# Patient Record
Sex: Male | Born: 1975 | Race: White | Hispanic: No | Marital: Single | Smoking: Never smoker
Health system: Southern US, Community
[De-identification: ages and names within clinical notes are randomized; demographics above are authoritative.]

## PROBLEM LIST (undated history)

## (undated) ENCOUNTER — Emergency Department: Discharge status: Absent without leave | Payer: Medicaid Other

## (undated) DIAGNOSIS — N189 Chronic kidney disease, unspecified: Secondary | ICD-10-CM

## (undated) DIAGNOSIS — F32A Depression, unspecified: Secondary | ICD-10-CM

## (undated) DIAGNOSIS — I10 Essential (primary) hypertension: Secondary | ICD-10-CM

## (undated) DIAGNOSIS — F1010 Alcohol abuse, uncomplicated: Secondary | ICD-10-CM

## (undated) DIAGNOSIS — E538 Deficiency of other specified B group vitamins: Secondary | ICD-10-CM

## (undated) DIAGNOSIS — D509 Iron deficiency anemia, unspecified: Secondary | ICD-10-CM

## (undated) DIAGNOSIS — F419 Anxiety disorder, unspecified: Secondary | ICD-10-CM

## (undated) DIAGNOSIS — T7840XA Allergy, unspecified, initial encounter: Secondary | ICD-10-CM

## (undated) DIAGNOSIS — M1990 Unspecified osteoarthritis, unspecified site: Secondary | ICD-10-CM

## (undated) DIAGNOSIS — R011 Cardiac murmur, unspecified: Secondary | ICD-10-CM

## (undated) DIAGNOSIS — Z5189 Encounter for other specified aftercare: Secondary | ICD-10-CM

## (undated) DIAGNOSIS — R7989 Other specified abnormal findings of blood chemistry: Secondary | ICD-10-CM

## (undated) DIAGNOSIS — Z9289 Personal history of other medical treatment: Secondary | ICD-10-CM

## (undated) DIAGNOSIS — D563 Thalassemia minor: Secondary | ICD-10-CM

## (undated) DIAGNOSIS — G4730 Sleep apnea, unspecified: Secondary | ICD-10-CM

## (undated) DIAGNOSIS — M109 Gout, unspecified: Secondary | ICD-10-CM

## (undated) HISTORY — DX: Chronic kidney disease, unspecified: N18.9

## (undated) HISTORY — DX: Depression, unspecified: F32.A

## (undated) HISTORY — PX: HAND SURGERY: SHX662

## (undated) HISTORY — DX: Sleep apnea, unspecified: G47.30

## (undated) HISTORY — DX: Allergy, unspecified, initial encounter: T78.40XA

## (undated) HISTORY — DX: Cardiac murmur, unspecified: R01.1

## (undated) HISTORY — DX: Anxiety disorder, unspecified: F41.9

## (undated) HISTORY — DX: Unspecified osteoarthritis, unspecified site: M19.90

## (undated) HISTORY — DX: Encounter for other specified aftercare: Z51.89

---

## 2020-09-27 ENCOUNTER — Other Ambulatory Visit: Payer: Self-pay

## 2020-09-27 ENCOUNTER — Emergency Department: Payer: BC Managed Care – PPO

## 2020-09-27 ENCOUNTER — Emergency Department
Admission: EM | Admit: 2020-09-27 | Discharge: 2020-09-27 | Disposition: A | Discharge status: Home / Self Care | Payer: BC Managed Care – PPO | Attending: Emergency Medicine | Admitting: Emergency Medicine

## 2020-09-27 ENCOUNTER — Encounter: Payer: Self-pay | Admitting: Emergency Medicine

## 2020-09-27 DIAGNOSIS — M25462 Effusion, left knee: Secondary | ICD-10-CM

## 2020-09-27 DIAGNOSIS — M25562 Pain in left knee: Secondary | ICD-10-CM | POA: Diagnosis present

## 2020-09-27 HISTORY — DX: Gout, unspecified: M10.9

## 2020-09-27 MED ORDER — DICLOFENAC SODIUM 75 MG PO TBEC
75.0000 mg | DELAYED_RELEASE_TABLET | Freq: Two times a day (BID) | ORAL | 0 refills | Status: DC
Start: 2020-09-27 — End: 2021-10-11

## 2020-09-27 MED ORDER — HYDROCODONE-ACETAMINOPHEN 5-325 MG PO TABS
1.0000 | ORAL_TABLET | Freq: Three times a day (TID) | ORAL | 0 refills | Status: AC | PRN
Start: 2020-09-27 — End: 2020-09-30

## 2020-09-27 MED ORDER — COLCHICINE 0.6 MG PO TABS: ORAL_TABLET | ORAL | 0 refills | Status: DC

## 2020-09-27 NOTE — ED Provider Notes (Signed)
[facility] Emergency Department Provider Note ____________________________________________  Time seen: 29  I have reviewed the triage vital signs and the nursing notes.  HISTORY  Chief Complaint  Knee Pain   HPI Shawn Wolfe is a 44 y.o. male presents himself to the ED for ongoing but slightly improved left knee pain.  Patient gives remote history of gout, with previous flares to the toe and ankle on the same leg.  He denies any preceding injury or trauma.  He describes significant swelling to the knee joint that made range of motion difficult.  He denies any interim fever, chills, sweats.  He did report some increased warmth to the knee joint, as well as some increased exquisite tenderness, but symptoms have somewhat improved after he began to take an over-the-counter gout joint supplement.  Patient presents with pain rated at a 6 out of 10 currently.  No other gout medicines have previously been prescribed patient does not have a current primary care provider, has never been treated for an acute gout flare in a formal setting.  He denies any history of chronic or ongoing knee problems.   Past Medical History:  Diagnosis Date  . Gout     There are no problems to display for this patient.   History reviewed. No pertinent surgical history.  Prior to Admission medications   Medication Sig Start Date End Date Taking? Authorizing Provider  colchicine 0.6 MG tablet Take 2 tabs Once  May repeat in 1 hour 09/27/20   Azim Gillingham, Charlesetta Ivory, PA-C  diclofenac (VOLTAREN) 75 MG EC tablet Take 1 tablet (75 mg total) by mouth 2 (two) times daily. 09/27/20   Noel Henandez, Charlesetta Ivory, PA-C  HYDROcodone-acetaminophen (NORCO) 5-325 MG tablet Take 1 tablet by mouth 3 (three) times daily as needed for up to 3 days. 09/27/20 09/30/20  Arren Laminack, Charlesetta Ivory, PA-C    Allergies Patient has no known allergies.  History reviewed. No pertinent family history.  Social  History Social History   Tobacco Use  . Smoking status: Not on file  Substance Use Topics  . Alcohol use: Not on file  . Drug use: Not on file    Review of Systems  Constitutional: Negative for fever. Cardiovascular: Negative for chest pain. Respiratory: Negative for shortness of breath. Gastrointestinal: Negative for abdominal pain, vomiting and diarrhea. Genitourinary: Negative for dysuria. Musculoskeletal: Negative for back pain.  Left knee swelling as above.   Skin: Negative for rash. Neurological: Negative for headaches, focal weakness or numbness. ____________________________________________  PHYSICAL EXAM:  VITAL SIGNS: ED Triage Vitals  Enc Vitals Group     BP 09/27/20 1427 (!) 144/85     Pulse Rate 09/27/20 1427 99     Resp 09/27/20 1427 14     Temp 09/27/20 1427 99.9 F (37.7 C)     Temp Source 09/27/20 1427 Oral     SpO2 09/27/20 1427 100 %     Weight 09/27/20 1135 180 lb (81.6 kg)     Height 09/27/20 1135 5\' 8"  (1.727 m)     Head Circumference --      Peak Flow --      Pain Score 09/27/20 1134 6     Pain Loc --      Pain Edu? --      Excl. in GC? --     Constitutional: Alert and oriented. Well appearing and in no distress. Head: Normocephalic and atraumatic. Eyes: Conjunctivae are normal. Normal extraocular movements Cardiovascular: Normal rate,  regular rhythm. Normal distal pulses. Respiratory: Normal respiratory effort. No wheezes/rales/rhonchi. Musculoskeletal: Left knee with a moderate joint effusion appreciated.  No erythema, warmth, or induration is noted overlying the knee joint.  No skin disruption, laceration, puncture is appreciated.  Normal active range of motion to the knee without signs of internal derangement.  Nontender with normal range of motion in all extremities.  Neurologic: Mildly antalgic gait without ataxia. Normal speech and language. No gross focal neurologic deficits are appreciated. Skin:  Skin is warm, dry and intact. No rash  noted. ____________________________________________   RADIOLOGY  DG Left Knee IMPRESSION: Large joint effusion.  Otherwise negative. ____________________________________________  PROCEDURES  Procedures ____________________________________________  INITIAL IMPRESSION / ASSESSMENT AND PLAN / ED COURSE  Patient with ED evaluation of joint swelling on the left knee.  Patient with a history of gout presents with persistent but improved swelling to the left knee.  Clinical picture is consistent with a moderate effusion likely representing a gout flare.  Patient will be treated with colchicine, diclofenac, and hydrocodone for breakthrough pain.  He is also referred to a local Ortho urgent care for continued symptoms if symptoms do not improve the next several days.  Return precautions have been discussed with patient is also advised to select a primary care provider for routine medical care.  Shawn Wolfe was evaluated in Emergency Department on 09/27/2020 for the symptoms described in the history of present illness. He was evaluated in the context of the global COVID-19 pandemic, which necessitated consideration that the patient might be at risk for infection with the SARS-CoV-2 virus that causes COVID-19. Institutional protocols and algorithms that pertain to the evaluation of patients at risk for COVID-19 are in a state of rapid change based on information released by regulatory bodies including the CDC and federal and state organizations. These policies and algorithms were followed during the patient's care in the ED.  I reviewed the patient's prescription history over the last 12 months in the multi-state controlled substances database(s) that includes [place], [place], [place], [place], [place], [place], [place], [place], [place], [place], [place], [place], [place], and [place].  Results were notable for no RX history.   ____________________________________________  FINAL CLINICAL IMPRESSION(S) / ED DIAGNOSES  Final diagnoses:  Effusion of left knee  Acute pain of left knee      Karmen Stabs, Charlesetta Ivory, PA-C 09/27/20 1916    Jene Every, MD 09/27/20 [id number]

## 2020-09-27 NOTE — ED Triage Notes (Signed)
Left knee pain x 3 days.  Denies injury.  States has history of gout.

## 2020-09-27 NOTE — ED Notes (Signed)
Left knee pain for few days wit swelling of entire knee.  He thought it was gout as he has history, but it is not going down.  No injury recalled.

## 2020-09-27 NOTE — Discharge Instructions (Signed)
Take the prescription meds as directed. Rest with the leg elevated and apply ice to reduce swelling. Follow-up with [facility] for ongoing symptoms. Select a primary care provider for routine medical care. Return as needed.

## 2020-12-18 ENCOUNTER — Other Ambulatory Visit: Payer: Self-pay

## 2020-12-18 ENCOUNTER — Emergency Department
Admission: EM | Admit: 2020-12-18 | Discharge: 2020-12-18 | Disposition: A | Discharge status: Home / Self Care | Payer: BC Managed Care – PPO | Attending: Emergency Medicine | Admitting: Emergency Medicine

## 2020-12-18 ENCOUNTER — Emergency Department: Payer: BC Managed Care – PPO

## 2020-12-18 ENCOUNTER — Encounter: Payer: Self-pay | Admitting: Emergency Medicine

## 2020-12-18 DIAGNOSIS — Y92002 Bathroom of unspecified non-institutional (private) residence single-family (private) house as the place of occurrence of the external cause: Secondary | ICD-10-CM | POA: Insufficient documentation

## 2020-12-18 DIAGNOSIS — M109 Gout, unspecified: Secondary | ICD-10-CM

## 2020-12-18 DIAGNOSIS — M25522 Pain in left elbow: Secondary | ICD-10-CM | POA: Diagnosis not present

## 2020-12-18 DIAGNOSIS — M5412 Radiculopathy, cervical region: Secondary | ICD-10-CM | POA: Insufficient documentation

## 2020-12-18 DIAGNOSIS — W19XXXA Unspecified fall, initial encounter: Secondary | ICD-10-CM | POA: Insufficient documentation

## 2020-12-18 DIAGNOSIS — I10 Essential (primary) hypertension: Secondary | ICD-10-CM | POA: Diagnosis not present

## 2020-12-18 DIAGNOSIS — M25562 Pain in left knee: Secondary | ICD-10-CM | POA: Insufficient documentation

## 2020-12-18 HISTORY — DX: Essential (primary) hypertension: I10

## 2020-12-18 MED ORDER — PREDNISONE 10 MG PO TABS: ORAL_TABLET | ORAL | 0 refills | Status: DC

## 2020-12-18 MED ORDER — PREDNISONE 20 MG PO TABS
60.0000 mg | ORAL_TABLET | Freq: Once | ORAL | Status: AC
  Administered 2020-12-18: 14:00:00 60 mg via ORAL
  Filled 2020-12-18: qty 3

## 2020-12-18 MED ORDER — NAPROXEN 500 MG PO TABS
500.0000 mg | ORAL_TABLET | Freq: Once | ORAL | Status: AC
  Administered 2020-12-18: 14:00:00 500 mg via ORAL
  Filled 2020-12-18: qty 1

## 2020-12-18 MED ORDER — NAPROXEN 500 MG PO TABS: 500.0000 mg | ORAL_TABLET | Freq: Two times a day (BID) | ORAL | 0 refills | Status: DC

## 2020-12-18 NOTE — ED Triage Notes (Signed)
Pt presents via POV with c/o shoulder pain post fall. Pt has gout which causes swelling in bilateral knees which caused him to fall last night. Pt hit left shoulder with fall and has limited mobility at this time. Pt denies LOC or hitting head with fall. No blood thinner use.

## 2020-12-18 NOTE — ED Provider Notes (Signed)
[facility] Emergency Department Provider Note  ____________________________________________  Time seen: Approximately 12:36 PM  I have reviewed the triage vital signs and the nursing notes.   HISTORY  Chief Complaint Fall    HPI Shawn Wolfe is a 44 y.o. male that presents to the emergency department for evaluation of right knee pain and left forearm pain.  Patient states that 1 week ago, he was lifting a heavy coffin at a funeral, when he pulled something in his left forearm.  He has developed pain that shoots from his forearm to his elbow since.  Several days ago, he developed right knee pain and swelling.  Knee is more painful when he tries to extend his knee.  Yesterday, his knee pain caused him to fall in the bathroom.  He landed on his left forearm and made his forearm pain worse than it has been the last week.  Pain is worse when he rotates his forearm.  He did not hit his head or lose consciousness.  Patient states that he does have a history of gout.  He was seen in this emergency department 2 months ago for left knee pain, had x-rays completed, and was started on medications for gout.  Knee pain and swelling in the left knee resolved. Right knee pain feels the same as the left did currently. Patient states that he enjoys eating red meat and drinking alcohol.  No IV drug use.  Patient works at [employer].  He is trying to establish with primary care.    Past Medical History:  Diagnosis Date  . Gout   . Hypertension     There are no problems to display for this patient.   History reviewed. No pertinent surgical history.  Prior to Admission medications   Medication Sig Start Date End Date Taking? Authorizing Provider  colchicine 0.6 MG tablet Take 2 tabs Once  May repeat in 1 hour 09/27/20   Menshew, Charlesetta Ivory, PA-C  diclofenac (VOLTAREN) 75 MG EC tablet Take 1 tablet (75 mg total) by mouth 2 (two) times daily. 09/27/20   Menshew, Charlesetta Ivory,  PA-C  naproxen (NAPROSYN) 500 MG tablet Take 1 tablet (500 mg total) by mouth 2 (two) times daily with a meal. 12/18/20 12/18/21  Enid Derry, PA-C  predniSONE (DELTASONE) 10 MG tablet Take 6 tablets on day 1, take 5 tablets on day 2, take 4 tablets on day 3, take 3 tablets on day 4, take 2 tablets on day 5, take 1 tablet on day 6 12/18/20   Enid Derry, PA-C    Allergies Sulfa antibiotics  History reviewed. No pertinent family history.  Social History Social History   Tobacco Use  . Smoking status: Never Smoker  . Smokeless tobacco: Current User     Review of Systems  Cardiovascular: No chest pain. Respiratory: No SOB. Gastrointestinal: No abdominal pain.  No nausea, no vomiting.  Genitourinary: Negative for dysuria. Musculoskeletal: Positive for left forearm and right knee pain. Skin: Negative for rash, abrasions, lacerations, ecchymosis. Neurological: Negative for headaches, numbness or tingling   ____________________________________________   PHYSICAL EXAM:  VITAL SIGNS: ED Triage Vitals  Enc Vitals Group     BP 12/18/20 1044 (!) 146/96     Pulse Rate 12/18/20 1044 89     Resp 12/18/20 1044 17     Temp 12/18/20 1044 98.7 F (37.1 C)     Temp Source 12/18/20 1044 Oral     SpO2 12/18/20 1044 96 %  Weight 12/18/20 1045 185 lb (83.9 kg)     Height 12/18/20 1045 5\' 7"  (1.702 m)     Head Circumference --      Peak Flow --      Pain Score 12/18/20 1045 7     Pain Loc --      Pain Edu? --      Excl. in GC? --      Constitutional: Alert and oriented. Well appearing and in no acute distress. Eyes: Conjunctivae are normal. PERRL. EOMI. Head: Atraumatic. ENT:      Ears:      Nose: No congestion/rhinnorhea.      Mouth/Throat: Mucous membranes are moist.  Neck: No stridor.  No cervical spine tenderness to palpation. Cardiovascular: Normal rate, regular rhythm.  Good peripheral circulation.  Symmetric radial pulses bilaterally. Respiratory: Normal  respiratory effort without tachypnea or retractions. Lungs CTAB. Good air entry to the bases with no decreased or absent breath sounds. Gastrointestinal: Bowel sounds 4 quadrants. Soft and nontender to palpation. No guarding or rigidity. No palpable masses. No distention.  Musculoskeletal: Full range of motion to all extremities. No gross deformities appreciated.  Right knee swelling and warmth.  Full range of motion of right knee with pain.  Full range of motion of left shoulder, left elbow, left wrist.  Mild tenderness to palpation to proximal left forearm next to the elbow.  Strength equal in upper extremities bilaterally. Neurologic:  Normal speech and language. No gross focal neurologic deficits are appreciated.  Skin:  Skin is warm, dry and intact. No rash noted. Psychiatric: Mood and affect are normal. Speech and behavior are normal. Patient exhibits appropriate insight and judgement.   ____________________________________________   LABS (all labs ordered are listed, but only abnormal results are displayed)  Labs Reviewed - No data to display ____________________________________________  EKG   ____________________________________________  RADIOLOGY 12/20/20, personally viewed and evaluated these images (plain radiographs) as part of my medical decision making, as well as reviewing the written report by the radiologist.  DG Forearm Left  Result Date: 12/18/2020 CLINICAL DATA:  Left forearm pain after fall earlier today. EXAM: LEFT FOREARM - 2 VIEW COMPARISON:  None. FINDINGS: There is no evidence of fracture or other focal bone lesions. Soft tissues are unremarkable. IMPRESSION: Negative. Electronically Signed   By: 12/20/2020 M.D.   On: 12/18/2020 13:16   DG Shoulder Left  Result Date: 12/18/2020 CLINICAL DATA:  Fall. EXAM: LEFT SHOULDER - 2+ VIEW COMPARISON:  None. FINDINGS: There is no evidence of fracture or dislocation. There is no evidence of arthropathy or  other focal bone abnormality. Soft tissues are unremarkable. IMPRESSION: Negative. Electronically Signed   By: 01-20-1973 M.D.   On: 12/18/2020 11:52    ____________________________________________    PROCEDURES  Procedure(s) performed:    Procedures    Medications  predniSONE (DELTASONE) tablet 60 mg (60 mg Oral Given 12/18/20 1427)  naproxen (NAPROSYN) tablet 500 mg (500 mg Oral Given 12/18/20 1427)     ____________________________________________   INITIAL IMPRESSION / ASSESSMENT AND PLAN / ED COURSE  Pertinent labs & imaging results that were available during my care of the patient were reviewed by me and considered in my medical decision making (see chart for details).  Review of the [place] CSRS was performed in accordance of the [facility] prior to dispensing any controlled drugs.   Patient's diagnosis is consistent with tendinitis in the left forearm and gout of the right knee.  Vital signs and  exam are reassuring.  Exam is consistent with a tendinitis in the left forearm.  X-rays are negative for fracture.  Knee exam is consistent with gout.  No risk factors for a septic joint.  Patient will be started on prednisone to cover him for gout and tendinitis.  He will also begin naproxen.  Patient will be discharged home with prescriptions for prednisone and naproxen. Patient is to follow up with orthopedics as directed. Patient is given ED precautions to return to the ED for any worsening or new symptoms.  Shawn Wolfe was evaluated in Emergency Department on 12/19/2020 for the symptoms described in the history of present illness. He was evaluated in the context of the global COVID-19 pandemic, which necessitated consideration that the patient might be at risk for infection with the SARS-CoV-2 virus that causes COVID-19. Institutional protocols and algorithms that pertain to the evaluation of patients at risk for COVID-19 are in a state of rapid change based on information  released by regulatory bodies including the CDC and federal and state organizations. These policies and algorithms were followed during the patient's care in the ED.   ____________________________________________  FINAL CLINICAL IMPRESSION(S) / ED DIAGNOSES  Final diagnoses:  Acute gout of left knee, unspecified cause  Left elbow pain  Cervical radiculopathy      NEW MEDICATIONS STARTED DURING THIS VISIT:  ED Discharge Orders         Ordered    predniSONE (DELTASONE) 10 MG tablet        12/18/20 1417    naproxen (NAPROSYN) 500 MG tablet  2 times daily with meals        12/18/20 1417              This chart was dictated using voice recognition software/Dragon. Despite best efforts to proofread, errors can occur which can change the meaning. Any change was purely unintentional.    Enid Derry, PA-C 12/19/20 0800    Arnaldo Natal, MD 12/20/20 [id number]

## 2020-12-18 NOTE — ED Notes (Addendum)
Pt c/o gout flair-up in right knee and states he slipped and fell last night and injured left forearm/shoulder. Pt denies head/other injury, denies dizziness/LOC. Pt states that mother recently passed and he has been coping w/ increased alcohol and food intake and that is what triggered gout. Right knee is red and swollen. Pt denies SI/HI.

## 2021-10-09 ENCOUNTER — Observation Stay: Payer: BC Managed Care – PPO

## 2021-10-09 ENCOUNTER — Emergency Department: Payer: BC Managed Care – PPO

## 2021-10-09 ENCOUNTER — Other Ambulatory Visit: Payer: Self-pay

## 2021-10-09 ENCOUNTER — Inpatient Hospital Stay
Admission: EM | Admit: 2021-10-09 | Discharge: 2021-10-11 | DRG: 554 | Disposition: A | Discharge status: Home / Self Care | Payer: BC Managed Care – PPO | Attending: Family Medicine | Admitting: Family Medicine

## 2021-10-09 DIAGNOSIS — R609 Edema, unspecified: Secondary | ICD-10-CM

## 2021-10-09 DIAGNOSIS — I10 Essential (primary) hypertension: Secondary | ICD-10-CM | POA: Diagnosis not present

## 2021-10-09 DIAGNOSIS — M10062 Idiopathic gout, left knee: Principal | ICD-10-CM | POA: Diagnosis present

## 2021-10-09 DIAGNOSIS — R7401 Elevation of levels of liver transaminase levels: Principal | ICD-10-CM

## 2021-10-09 DIAGNOSIS — Z809 Family history of malignant neoplasm, unspecified: Secondary | ICD-10-CM

## 2021-10-09 DIAGNOSIS — D696 Thrombocytopenia, unspecified: Secondary | ICD-10-CM | POA: Diagnosis present

## 2021-10-09 DIAGNOSIS — Z87891 Personal history of nicotine dependence: Secondary | ICD-10-CM

## 2021-10-09 DIAGNOSIS — R29898 Other symptoms and signs involving the musculoskeletal system: Secondary | ICD-10-CM

## 2021-10-09 DIAGNOSIS — E860 Dehydration: Secondary | ICD-10-CM | POA: Diagnosis present

## 2021-10-09 DIAGNOSIS — D563 Thalassemia minor: Secondary | ICD-10-CM

## 2021-10-09 DIAGNOSIS — R509 Fever, unspecified: Secondary | ICD-10-CM

## 2021-10-09 DIAGNOSIS — A084 Viral intestinal infection, unspecified: Secondary | ICD-10-CM | POA: Diagnosis present

## 2021-10-09 DIAGNOSIS — D649 Anemia, unspecified: Secondary | ICD-10-CM | POA: Diagnosis not present

## 2021-10-09 DIAGNOSIS — M109 Gout, unspecified: Secondary | ICD-10-CM | POA: Diagnosis present

## 2021-10-09 DIAGNOSIS — M1009 Idiopathic gout, multiple sites: Secondary | ICD-10-CM | POA: Insufficient documentation

## 2021-10-09 DIAGNOSIS — Z20822 Contact with and (suspected) exposure to covid-19: Secondary | ICD-10-CM | POA: Diagnosis present

## 2021-10-09 DIAGNOSIS — R600 Localized edema: Secondary | ICD-10-CM | POA: Diagnosis present

## 2021-10-09 DIAGNOSIS — E538 Deficiency of other specified B group vitamins: Secondary | ICD-10-CM

## 2021-10-09 DIAGNOSIS — M25461 Effusion, right knee: Secondary | ICD-10-CM

## 2021-10-09 DIAGNOSIS — M10061 Idiopathic gout, right knee: Secondary | ICD-10-CM | POA: Diagnosis present

## 2021-10-09 DIAGNOSIS — E876 Hypokalemia: Secondary | ICD-10-CM | POA: Diagnosis present

## 2021-10-09 DIAGNOSIS — Z79899 Other long term (current) drug therapy: Secondary | ICD-10-CM

## 2021-10-09 DIAGNOSIS — M25561 Pain in right knee: Secondary | ICD-10-CM

## 2021-10-09 DIAGNOSIS — M25562 Pain in left knee: Secondary | ICD-10-CM | POA: Insufficient documentation

## 2021-10-09 DIAGNOSIS — R7989 Other specified abnormal findings of blood chemistry: Secondary | ICD-10-CM

## 2021-10-09 DIAGNOSIS — Z8249 Family history of ischemic heart disease and other diseases of the circulatory system: Secondary | ICD-10-CM

## 2021-10-09 DIAGNOSIS — Z23 Encounter for immunization: Secondary | ICD-10-CM

## 2021-10-09 DIAGNOSIS — F1010 Alcohol abuse, uncomplicated: Secondary | ICD-10-CM

## 2021-10-09 DIAGNOSIS — D509 Iron deficiency anemia, unspecified: Principal | ICD-10-CM

## 2021-10-09 DIAGNOSIS — Z882 Allergy status to sulfonamides status: Secondary | ICD-10-CM

## 2021-10-09 HISTORY — DX: Thalassemia minor: D56.3

## 2021-10-09 LAB — RETICULOCYTES
Immature Retic Fract: 29.8 % — ABNORMAL HIGH (ref 2.3–15.9)
RBC.: 4.09 MIL/uL — ABNORMAL LOW (ref 4.22–5.81)
Retic Count, Absolute: 99.6 10*3/uL (ref 19.0–186.0)
Retic Ct Pct: 2.4 % (ref 0.4–3.1)

## 2021-10-09 LAB — CBC WITH DIFFERENTIAL/PLATELET
Abs Immature Granulocytes: 0.05 10*3/uL (ref 0.00–0.07)
Basophils Absolute: 0 10*3/uL (ref 0.0–0.1)
Basophils Relative: 0 %
Eosinophils Absolute: 0 10*3/uL (ref 0.0–0.5)
Eosinophils Relative: 0 %
HCT: 29.7 % — ABNORMAL LOW (ref 39.0–52.0)
Hemoglobin: 9.7 g/dL — ABNORMAL LOW (ref 13.0–17.0)
Immature Granulocytes: 1 %
Lymphocytes Relative: 8 %
Lymphs Abs: 0.6 10*3/uL — ABNORMAL LOW (ref 0.7–4.0)
MCH: 23.8 pg — ABNORMAL LOW (ref 26.0–34.0)
MCHC: 32.7 g/dL (ref 30.0–36.0)
MCV: 72.8 fL — ABNORMAL LOW (ref 80.0–100.0)
Monocytes Absolute: 0.6 10*3/uL (ref 0.1–1.0)
Monocytes Relative: 7 %
Neutro Abs: 6.6 10*3/uL (ref 1.7–7.7)
Neutrophils Relative %: 84 %
Platelets: 147 10*3/uL — ABNORMAL LOW (ref 150–400)
RBC: 4.08 MIL/uL — ABNORMAL LOW (ref 4.22–5.81)
RDW: 18.6 % — ABNORMAL HIGH (ref 11.5–15.5)
WBC: 7.9 10*3/uL (ref 4.0–10.5)
nRBC: 0.3 % — ABNORMAL HIGH (ref 0.0–0.2)

## 2021-10-09 LAB — URINALYSIS, COMPLETE (UACMP) WITH MICROSCOPIC
Bacteria, UA: NONE SEEN
Bilirubin Urine: NEGATIVE
Glucose, UA: NEGATIVE mg/dL
Hgb urine dipstick: NEGATIVE
Ketones, ur: NEGATIVE mg/dL
Leukocytes,Ua: NEGATIVE
Nitrite: NEGATIVE
Protein, ur: NEGATIVE mg/dL
Specific Gravity, Urine: 1.004 — ABNORMAL LOW (ref 1.005–1.030)
Squamous Epithelial / HPF: NONE SEEN (ref 0–5)
pH: 6 (ref 5.0–8.0)

## 2021-10-09 LAB — D-DIMER, QUANTITATIVE: D-Dimer, Quant: 0.53 ug/mL-FEU — ABNORMAL HIGH (ref 0.00–0.50)

## 2021-10-09 LAB — COMPREHENSIVE METABOLIC PANEL
ALT: 35 U/L (ref 0–44)
AST: 94 U/L — ABNORMAL HIGH (ref 15–41)
Albumin: 4 g/dL (ref 3.5–5.0)
Alkaline Phosphatase: 33 U/L — ABNORMAL LOW (ref 38–126)
Anion gap: 12 (ref 5–15)
BUN: 6 mg/dL (ref 6–20)
CO2: 29 mmol/L (ref 22–32)
Calcium: 9.5 mg/dL (ref 8.9–10.3)
Chloride: 92 mmol/L — ABNORMAL LOW (ref 98–111)
Creatinine, Ser: 0.8 mg/dL (ref 0.61–1.24)
GFR, Estimated: >60 mL/min (ref >60)
Glucose, Bld: 121 mg/dL — ABNORMAL HIGH (ref 70–99)
Potassium: 2.9 mmol/L — ABNORMAL LOW (ref 3.5–5.1)
Sodium: 133 mmol/L — ABNORMAL LOW (ref 135–145)
Total Bilirubin: 3.6 mg/dL — ABNORMAL HIGH (ref 0.3–1.2)
Total Protein: 8 g/dL (ref 6.5–8.1)

## 2021-10-09 LAB — LACTIC ACID, PLASMA
Lactic Acid, Venous: 1.7 mmol/L (ref 0.5–1.9)
Lactic Acid, Venous: 1.3 mmol/L (ref 0.5–1.9)

## 2021-10-09 LAB — MAGNESIUM: Magnesium: 1.8 mg/dL (ref 1.7–2.4)

## 2021-10-09 LAB — PROTIME-INR
INR: 1 (ref 0.8–1.2)
Prothrombin Time: 13.6 seconds (ref 11.4–15.2)

## 2021-10-09 LAB — BILIRUBIN, FRACTIONATED(TOT/DIR/INDIR)
Bilirubin, Direct: 0.6 mg/dL — ABNORMAL HIGH (ref 0.0–0.2)
Indirect Bilirubin: 3.1 mg/dL — ABNORMAL HIGH (ref 0.3–0.9)
Total Bilirubin: 3.7 mg/dL — ABNORMAL HIGH (ref 0.3–1.2)

## 2021-10-09 LAB — IRON AND TIBC
Iron: 10 ug/dL — ABNORMAL LOW (ref 45–182)
Saturation Ratios: 3 % — ABNORMAL LOW (ref 17.9–39.5)
TIBC: 330 ug/dL (ref 250–450)
UIBC: 320 ug/dL

## 2021-10-09 LAB — RESP PANEL BY RT-PCR (FLU A&B, COVID) ARPGX2
Influenza A by PCR: NEGATIVE
Influenza B by PCR: NEGATIVE
SARS Coronavirus 2 by RT PCR: NEGATIVE

## 2021-10-09 LAB — FIBRINOGEN: Fibrinogen: >800 mg/dL — ABNORMAL HIGH (ref 210–475)

## 2021-10-09 LAB — FERRITIN: Ferritin: 392 ng/mL — ABNORMAL HIGH (ref 24–336)

## 2021-10-09 LAB — SEDIMENTATION RATE: Sed Rate: 54 mm/hr — ABNORMAL HIGH (ref 0–15)

## 2021-10-09 LAB — BRAIN NATRIURETIC PEPTIDE: B Natriuretic Peptide: 184.2 pg/mL — ABNORMAL HIGH (ref 0.0–100.0)

## 2021-10-09 LAB — FOLATE: Folate: 7 ng/mL (ref >5.9)

## 2021-10-09 LAB — URIC ACID: Uric Acid, Serum: 8.3 mg/dL (ref 3.7–8.6)

## 2021-10-09 LAB — LIPASE, BLOOD: Lipase: 24 U/L (ref 11–51)

## 2021-10-09 MED ORDER — LEVOFLOXACIN 750 MG PO TABS
750.0000 mg | ORAL_TABLET | Freq: Every day | ORAL | Status: DC
  Administered 2021-10-09 – 2021-10-10 (×2): 750 mg via ORAL
  Filled 2021-10-09 (×2): qty 1

## 2021-10-09 MED ORDER — MAGNESIUM SULFATE IN D5W 1-5 GM/100ML-% IV SOLN
1.0000 g | Freq: Once | INTRAVENOUS | Status: AC
  Administered 2021-10-09: 1 g via INTRAVENOUS
  Filled 2021-10-09: qty 100

## 2021-10-09 MED ORDER — LISINOPRIL 20 MG PO TABS
20.0000 mg | ORAL_TABLET | Freq: Every day | ORAL | Status: DC
  Administered 2021-10-09 – 2021-10-11 (×2): 20 mg via ORAL
  Filled 2021-10-09: qty 1
  Filled 2021-10-09: qty 2
  Filled 2021-10-09: qty 1

## 2021-10-09 MED ORDER — POTASSIUM CHLORIDE CRYS ER 20 MEQ PO TBCR
20.0000 meq | EXTENDED_RELEASE_TABLET | Freq: Two times a day (BID) | ORAL | Status: AC
  Administered 2021-10-10 (×2): 20 meq via ORAL
  Filled 2021-10-09 (×2): qty 1

## 2021-10-09 MED ORDER — ACETAMINOPHEN 325 MG PO TABS
650.0000 mg | ORAL_TABLET | Freq: Once | ORAL | Status: AC
  Administered 2021-10-09: 650 mg via ORAL
  Filled 2021-10-09: qty 2

## 2021-10-09 MED ORDER — HYDROCHLOROTHIAZIDE 12.5 MG PO CAPS
12.5000 mg | ORAL_CAPSULE | Freq: Every day | ORAL | Status: DC
  Administered 2021-10-09: 12.5 mg via ORAL
  Filled 2021-10-09 (×3): qty 1

## 2021-10-09 MED ORDER — POTASSIUM CHLORIDE IN NACL 20-0.45 MEQ/L-% IV SOLN
INTRAVENOUS | Status: DC
  Filled 2021-10-09 (×7): qty 1000

## 2021-10-09 MED ORDER — ONDANSETRON HCL 4 MG/2ML IJ SOLN: 4.0000 mg | Freq: Four times a day (QID) | INTRAMUSCULAR | Status: DC | PRN

## 2021-10-09 MED ORDER — KETOROLAC TROMETHAMINE 30 MG/ML IJ SOLN
30.0000 mg | Freq: Four times a day (QID) | INTRAMUSCULAR | Status: DC | PRN
  Administered 2021-10-09 – 2021-10-10 (×2): 30 mg via INTRAVENOUS
  Filled 2021-10-09 (×3): qty 1

## 2021-10-09 MED ORDER — OXYCODONE-ACETAMINOPHEN 5-325 MG PO TABS
1.0000 | ORAL_TABLET | Freq: Once | ORAL | Status: AC
  Administered 2021-10-09: 1 via ORAL
  Filled 2021-10-09: qty 1

## 2021-10-09 MED ORDER — POTASSIUM CHLORIDE 10 MEQ/100ML IV SOLN
10.0000 meq | INTRAVENOUS | Status: AC
  Administered 2021-10-09 (×2): 10 meq via INTRAVENOUS
  Filled 2021-10-09 (×2): qty 100

## 2021-10-09 MED ORDER — TRAZODONE HCL 50 MG PO TABS
25.0000 mg | ORAL_TABLET | Freq: Every evening | ORAL | Status: DC | PRN
  Administered 2021-10-09: 25 mg via ORAL
  Filled 2021-10-09: qty 1

## 2021-10-09 MED ORDER — ACETAMINOPHEN 500 MG PO TABS
1000.0000 mg | ORAL_TABLET | Freq: Three times a day (TID) | ORAL | Status: DC
  Administered 2021-10-09 – 2021-10-11 (×6): 1000 mg via ORAL
  Filled 2021-10-09 (×6): qty 2

## 2021-10-09 MED ORDER — ONDANSETRON HCL 4 MG PO TABS: 4.0000 mg | ORAL_TABLET | Freq: Four times a day (QID) | ORAL | Status: DC | PRN

## 2021-10-09 MED ORDER — SODIUM CHLORIDE 0.9 % IV BOLUS
500.0000 mL | Freq: Once | INTRAVENOUS | Status: AC
  Administered 2021-10-09: 500 mL via INTRAVENOUS

## 2021-10-09 MED ORDER — ENOXAPARIN SODIUM 40 MG/0.4ML IJ SOSY
40.0000 mg | PREFILLED_SYRINGE | INTRAMUSCULAR | Status: DC
  Administered 2021-10-09 – 2021-10-10 (×2): 40 mg via SUBCUTANEOUS
  Filled 2021-10-09 (×2): qty 0.4

## 2021-10-09 MED ORDER — MORPHINE SULFATE (PF) 4 MG/ML IV SOLN
4.0000 mg | Freq: Once | INTRAVENOUS | Status: AC
  Administered 2021-10-09: 4 mg via INTRAVENOUS
  Filled 2021-10-09: qty 1

## 2021-10-09 NOTE — ED Provider Notes (Signed)
[facility] Emergency Department Provider Note   ____________________________________________   Event Date/Time   First MD Initiated Contact with Patient 10/09/21 1637     (approximate)  I have reviewed the triage vital signs and the nursing notes.   HISTORY  Chief Complaint Fever and leg swelling  HPI Shawn Wolfe is a 45 y.o. male reports since Friday he has noticed that he has not quite felt well it started with nausea and vomiting, then progressed to noticing he was very swollen in his feet and ankles all the way up to the level of his knees.  Ports he feels achy throughout his joints including both ankles both knees across his feet.  No longer having any vomiting.  No chest pain or shortness of breath.  No headache.  Has been feeling fatigued and rather dehydrated  Decreased appetite with nausea.  No black or bloody emesis.  No diarrhea  No tick bites or insect bites.  No recent trips or travel.  Has been having fever off and on.   Past Medical History:  Diagnosis Date   Gout    Hypertension     Patient Active Problem List   Diagnosis Date Noted   Gout 10/09/2021   HTN (hypertension) 10/09/2021   Fever 10/09/2021   Hypokalemia 10/09/2021   Bilateral leg edema 10/09/2021    History reviewed. No pertinent surgical history.  Prior to Admission medications   Medication Sig Start Date End Date Taking? Authorizing Provider  colchicine 0.6 MG tablet Take 2 tabs Once  May repeat in 1 hour 09/27/20   Menshew, Charlesetta Ivory, PA-C  diclofenac (VOLTAREN) 75 MG EC tablet Take 1 tablet (75 mg total) by mouth 2 (two) times daily. 09/27/20   Menshew, Charlesetta Ivory, PA-C  naproxen (NAPROSYN) 500 MG tablet Take 1 tablet (500 mg total) by mouth 2 (two) times daily with a meal. 12/18/20 12/18/21  Enid Derry, PA-C  predniSONE (DELTASONE) 10 MG tablet Take 6 tablets on day 1, take 5 tablets on day 2, take 4 tablets on day 3, take 3 tablets on day 4,  take 2 tablets on day 5, take 1 tablet on day 6 12/18/20   Enid Derry, PA-C    Allergies Sulfa antibiotics  History reviewed. No pertinent family history.  Social History Social History   Tobacco Use   Smoking status: Never   Smokeless tobacco: Current  Alcohol, has drank about 6 or more alcoholic drinks daily since age 46.  Reports he never really lapses in this, and is drank alcohol daily for at least 20 years.  He has not had alcoholic drink for about the last 3 days though after he started feeling ill.  Review of Systems Constitutional: Some fevers and chills Eyes: No visual changes. ENT: No sore throat. Cardiovascular: Denies chest pain. Respiratory: Denies shortness of breath. Gastrointestinal: No abdominal pain.  Positive for nausea and vomiting mostly on Friday but now no appetite last 2 days. Genitourinary: Negative for dysuria. Musculoskeletal: Negative for back pain.  New swelling from about his knees down in both legs.  Occurring over the last 2 to 3 days. Skin: Negative for rash. Neurological: Negative for headaches, areas of focal weakness or numbness.    ____________________________________________   PHYSICAL EXAM:  VITAL SIGNS: ED Triage Vitals  Enc Vitals Group     BP 10/09/21 1422 (!) 141/79     Pulse Rate 10/09/21 1422 94     Resp 10/09/21 1422 18  Temp 10/09/21 1422 (!) 101.1 F (38.4 C)     Temp Source 10/09/21 1422 Oral     SpO2 10/09/21 1422 100 %     Weight 10/09/21 1423 172 lb (78 kg)     Height 10/09/21 1423 5\' 6"  (1.676 m)     Head Circumference --      Peak Flow --      Pain Score 10/09/21 1423 10     Pain Loc --      Pain Edu? --      Excl. in GC? --     Constitutional: Alert and oriented.  Mildly ill-appearing but in no acute distress. Eyes: Conjunctivae are slightly jaundiced. Head: Atraumatic. Nose: No congestion/rhinnorhea. Mouth/Throat: Mucous membranes are moist. Neck: No stridor.  Cardiovascular: Normal rate,  regular rhythm. Grossly normal heart sounds.  Good peripheral circulation. Respiratory: Normal respiratory effort.  No retractions. Lungs CTAB. Gastrointestinal: Soft and nontender. No distention.  Denies abdominal pain to palpation in any quadrant.  No pain McBurney's point.  Negative Murphy. Musculoskeletal: No lower extremity tenderness but he does exhibit rather diffuse edema from approximately the mid thigh all the way to the feet bilaterally.  He has notable edema and reports tenderness across his lower extremities bilaterally and across joint lines including the knee ankles and toes bilateral.  There is no overlying erythema of any joint they do not appear to have any warmth of the joint, and do not see any findings that would point towards a septic arthropathy by clinical exam. Neurologic:  Normal speech and language. No gross focal neurologic deficits are appreciated.  Skin:  Skin is warm, dry and intact. No rash noted. Psychiatric: Mood and affect are normal. Speech and behavior are normal.  ____________________________________________   LABS (all labs ordered are listed, but only abnormal results are displayed)  Labs Reviewed  COMPREHENSIVE METABOLIC PANEL - Abnormal; Notable for the following components:      Result Value   Sodium 133 (*)    Potassium 2.9 (*)    Chloride 92 (*)    Glucose, Bld 121 (*)    AST 94 (*)    Alkaline Phosphatase 33 (*)    Total Bilirubin 3.6 (*)    All other components within normal limits  CBC WITH DIFFERENTIAL/PLATELET - Abnormal; Notable for the following components:   RBC 4.08 (*)    Hemoglobin 9.7 (*)    HCT 29.7 (*)    MCV 72.8 (*)    MCH 23.8 (*)    RDW 18.6 (*)    Platelets 147 (*)    nRBC 0.3 (*)    Lymphs Abs 0.6 (*)    All other components within normal limits  BRAIN NATRIURETIC PEPTIDE - Abnormal; Notable for the following components:   B Natriuretic Peptide 184.2 (*)    All other components within normal limits  BILIRUBIN,  FRACTIONATED(TOT/DIR/INDIR) - Abnormal; Notable for the following components:   Total Bilirubin 3.7 (*)    Bilirubin, Direct 0.6 (*)    Indirect Bilirubin 3.1 (*)    All other components within normal limits  CULTURE, BLOOD (ROUTINE X 2)  CULTURE, BLOOD (ROUTINE X 2)  RESP PANEL BY RT-PCR (FLU A&B, COVID) ARPGX2  LACTIC ACID, PLASMA  LACTIC ACID, PLASMA  PROTIME-INR  MAGNESIUM  LIPASE, BLOOD  URINALYSIS, COMPLETE (UACMP) WITH MICROSCOPIC  FIBRINOGEN  HAPTOGLOBIN  ROCKY MTN SPOTTED FVR ABS PNL(IGG+IGM)  LYME DISEASE, WESTERN BLOT  D-DIMER, QUANTITATIVE  HEPATITIS PANEL, ACUTE  ____________________________________________  RADIOLOGY  DG  Chest 2 View  Result Date: 10/09/2021 CLINICAL DATA:  Bilateral lower extremity swelling EXAM: CHEST - 2 VIEW COMPARISON:  None. FINDINGS: The cardiomediastinal silhouette is normal. There is no focal consolidation or pulmonary edema. There is no pleural effusion or pneumothorax. There is no acute osseous abnormality. IMPRESSION: No radiographic evidence of acute cardiopulmonary process. Electronically Signed   By: Lesia Hausen M.D.   On: 10/09/2021 15:13   US Abdomen Complete  Result Date: 10/09/2021 CLINICAL DATA:  Fever, liver disease EXAM: ABDOMEN ULTRASOUND COMPLETE COMPARISON:  None. FINDINGS: Gallbladder: No gallstones or wall thickening visualized. No sonographic Murphy sign noted by sonographer. Common bile duct: Diameter: Normal caliber, 4 mm Liver: No focal lesion identified. Within normal limits in parenchymal echogenicity. Portal vein is patent on color Doppler imaging with normal direction of blood flow towards the liver. IVC: No abnormality visualized. Pancreas: Visualized portion unremarkable. Spleen: Size and appearance within normal limits. Right Kidney: Length: 10.3 cm. Echogenicity within normal limits. No mass or hydronephrosis visualized. Left Kidney: Length: 11.1 cm. Echogenicity within normal limits. No mass or hydronephrosis  visualized. Abdominal aorta: No aneurysm visualized. Other findings: None. IMPRESSION: No acute findings. Electronically Signed   By: Charlett Nose M.D.   On: 10/09/2021 18:26     Chest x-ray reviewed negative for acute finding.  Ultrasound abdomen negative for acute findings. ____________________________________________   PROCEDURES  Procedure(s) performed: None  Procedures  Critical Care performed: No  ____________________________________________   INITIAL IMPRESSION / ASSESSMENT AND PLAN / ED COURSE  Pertinent labs & imaging results that were available during my care of the patient were reviewed by me and considered in my medical decision making (see chart for details).   Patient presents with a constellation of concerning findings including a fever, new edema, jaundice.  Initial symptoms were gastrointestinal with nausea and vomiting.  Also notable for a heavy alcohol use history in the past.  Reports that all the swelling he seen his new is never had edema before.  Does have a distant history of gout but nothing suggest a focal arthropathy by exam.  Confirm with the lab no schistocytes.  Awaiting additional labs that could point towards a possible DIC type panel as his platelet count is somewhat low his hemoglobin is also low but his pathology does not seem to strongly suggest DIC or TTP.  No uremia.  Patient does not have clinical evidence by physical exam of obvious ascites, but will obtain ultrasound including right upper quadrant evaluation.  I have suspicion this presentation may be related to alcohol use, hepatitis or other hepatobiliary type pathology.  He has a otherwise reassuring abdominal exam.  Other infectious etiologies are considered, blood culture sent, pending testing for additional pathogens.  Clinical Course as of 10/09/21 2003  Sun Oct 09, 2021  1701 Hemoglobin(!): 9.7 [MQ]  1701 Platelets(!): 147 [MQ]  1701 Sodium(!): 133 [MQ]  1701 Potassium(!): 2.9 [MQ]   1701 Chloride(!): 92 [MQ]  1701 Total Bilirubin(!): 3.6 [MQ]  1701 AST(!): 94 [MQ]  1701 INR: 1.0 Abnormals, and INR is normal. Concern for liver disease or other etiology of hyperbili [MQ]  1704 Discussed CBC with lab tech, NO schistocytes. The slide is available and they do see concerns of polychormasia. NO CELL FRAGMENTS  [MQ]  1842 Admit discussed with Dr. Arthur Holms (will see in ED). Discussed case, and he will see. Consideration for antibiotics, but that this point also feel could be acute hepatic disease (? Hepaitis virus), etc. [MQ]  1850 Updated patient  on current results.  He is understanding agreeable with plan for admission.  Reports he feels much better now having received a dose of morphine for the aching that he had in all of his joints of his lower legs.  He is awake alert resting comfortably.  Patient appears reasonably stabilized for admission to the hospital for further care and management of his condition. [MQ]  1850 At this juncture I do not see clear evidence of a hemolytic anemia. [MQ]    Clinical Course User Index [MQ] Sharyn Creamer, MD   ----------------------------------------- 7:03 PM on 10/09/2021 ----------------------------------------- Dr. Debby Bud of hospitalist service and seeing and assessing patient for admission.    ____________________________________________   FINAL CLINICAL IMPRESSION(S) / ED DIAGNOSES  Final diagnoses:  Fever  Transaminitis  Hyperbilirubinemia  Bilateral lower extremity edema  Arthralgia of both knees        Note:  This document was prepared using Dragon voice recognition software and may include unintentional dictation errors       Sharyn Creamer, MD 10/09/21 2003

## 2021-10-09 NOTE — H&P (Signed)
History and Physical    Shawn Wolfe TVA:[id number] DOB: July 20, 1976 DOA: 10/09/2021  PCP: Shawn Conroy, MD (Confirm with patient/family/NH records and if not entered, this has to be entered at [facility] point of entry) Patient coming from: home  I have personally briefly reviewed patient's old medical records in [facility]  Chief Complaint: N/V/D and fever  HPI: Shawn Wolfe is a 45 y.o. male with medical history significant of HTN, gout was in his usual state of health until Late Friday when he started feeling sick at work. He had an episode of vomiting and diarrhea resulting in leaving work early. He had several more episodes of N/V and diarrhea. He denies hematochezia or hematemesis. Saturday he developed fever, swelling of both knees, left > right, pain in the legs. Due to his symptoms he presents to [facility] ED for evaluation. He denies any cough, SOB, chest pain, gout like pain, dyspepsia.  ED Course: Tmax 101.1 to 99.4 at time of exam, 116/77 HR 82  RR 14. EDP exam notable for swelling at knees. Lab with K 2.9, Cr 0.8, AST 94, T. Bili 3.6, lactic acid 1.7 NAD, Abdominal U/S negative for acute process. TRH called to admit for continue evaluation and treatment.   Review of Systems: As per HPI otherwise 10 point review of systems negative.    Past Medical History:  Diagnosis Date   Gout    Hypertension     Past Surgical History:  Procedure Laterality Date   HAND SURGERY Right    mutiple fractures requiring ORIF - pins later removed.    Soc Hx - SO x 22 years, no children. Works as Forensic psychologist. Drinks 6 beers a day on average.    reports that he has never smoked. He uses smokeless tobacco. No history on file for alcohol use and drug use.  Allergies  Allergen Reactions   Sulfa Antibiotics Hives    Family History  Problem Relation Age of Onset   Hypertension Mother    Liver disease Mother    Cancer Father    Cancer Sister    Hypertension Brother       Prior to Admission medications   Medication Sig Start Date End Date Taking? Authorizing Provider  colchicine 0.6 MG tablet Take 2 tabs Once  May repeat in 1 hour 09/27/20   Menshew, Charlesetta Ivory, PA-C  diclofenac (VOLTAREN) 75 MG EC tablet Take 1 tablet (75 mg total) by mouth 2 (two) times daily. 09/27/20   Menshew, Charlesetta Ivory, PA-C  naproxen (NAPROSYN) 500 MG tablet Take 1 tablet (500 mg total) by mouth 2 (two) times daily with a meal. 12/18/20 12/18/21  Enid Derry, PA-C  predniSONE (DELTASONE) 10 MG tablet Take 6 tablets on day 1, take 5 tablets on day 2, take 4 tablets on day 3, take 3 tablets on day 4, take 2 tablets on day 5, take 1 tablet on day 6 12/18/20   Enid Derry, PA-C    Physical Exam: Vitals:   10/09/21 1423 10/09/21 1700 10/09/21 1804 10/09/21 1830  BP:  112/83 (!) 109/97 116/77  Pulse:  77 83 82  Resp:  14 18 14   Temp:   99.4 F (37.4 C)   TempSrc:   Oral   SpO2:  98% 99% 98%  Weight: 78 kg     Height: 5\' 6"  (1.676 m)        Vitals:   10/09/21 1423 10/09/21 1700 10/09/21 1804 10/09/21 1830  BP:  112/83 10/11/21)  109/97 116/77  Pulse:  77 83 82  Resp:  $Remo'14 18 14  'SkTzj$ Temp:   99.4 F (37.4 C)   TempSrc:   Oral   SpO2:  98% 99% 98%  Weight: 78 kg     Height: $Remove'5\' 6"'soGjlOG$  (1.676 m)      General: WNWD man in no distress$RemoveBefor' \\Eyes'DQXaYIVNJEqw$ : PERRL, lids normal, very slight scleral icterus ENMT: Mucous membranes are moist. Posterior pharynx clear of any exudate or lesions.Normal dentition.  Neck: normal, supple, no masses, no thyromegaly Respiratory: clear to auscultation bilaterally, no wheezing, no crackles. Normal respiratory effort. No accessory muscle use.  Cardiovascular: Regular rate and rhythm, no murmurs / rubs / gallops. 2+ pedal pulses. No carotid bruits.  Abdomen: no tenderness, no masses palpated. No hepatosplenomegaly. Bowel sounds positive.  Musculoskeletal: no clubbing / cyanosis. Swelling around both knees left>right, firm to palpation w/o fluctuance. Normal  muscle tone.  Skin: very feint erythematous macular rash inner aspect left elbow, distal left inner calf -size of 50 cent piece, no lesions, ulcers. No induration Neurologic: CN 2-12 grossly intact.Strength 5/5 in all 4.  Psychiatric: Normal judgment and insight. Alert and oriented x 3. Normal mood.     Labs on Admission: I have personally reviewed following labs and imaging studies  CBC: Recent Labs  Lab 10/09/21 1428  WBC 7.9  NEUTROABS 6.6  HGB 9.7*  HCT 29.7*  MCV 72.8*  PLT 219*   Basic Metabolic Panel: Recent Labs  Lab 10/09/21 1428 10/09/21 1703  NA 133*  --   K 2.9*  --   CL 92*  --   CO2 29  --   GLUCOSE 121*  --   BUN 6  --   CREATININE 0.80  --   CALCIUM 9.5  --   MG  --  1.8   GFR: Estimated Creatinine Clearance: 112.2 mL/min (by C-G formula based on SCr of 0.8 mg/dL). Liver Function Tests: Recent Labs  Lab 10/09/21 1428 10/09/21 1703  AST 94*  --   ALT 35  --   ALKPHOS 33*  --   BILITOT 3.6* 3.7*  PROT 8.0  --   ALBUMIN 4.0  --    Recent Labs  Lab 10/09/21 1428  LIPASE 24   No results for input(s): AMMONIA in the last 168 hours. Coagulation Profile: Recent Labs  Lab 10/09/21 1428  INR 1.0   Cardiac Enzymes: No results for input(s): CKTOTAL, CKMB, CKMBINDEX, TROPONINI in the last 168 hours. BNP (last 3 results) No results for input(s): PROBNP in the last 8760 hours. HbA1C: No results for input(s): HGBA1C in the last 72 hours. CBG: No results for input(s): GLUCAP in the last 168 hours. Lipid Profile: No results for input(s): CHOL, HDL, LDLCALC, TRIG, CHOLHDL, LDLDIRECT in the last 72 hours. Thyroid Function Tests: No results for input(s): TSH, T4TOTAL, FREET4, T3FREE, THYROIDAB in the last 72 hours. Anemia Panel: No results for input(s): VITAMINB12, FOLATE, FERRITIN, TIBC, IRON, RETICCTPCT in the last 72 hours. Urine analysis:    Component Value Date/Time   COLORURINE YELLOW (A) 10/09/2021 1703   APPEARANCEUR CLEAR (A)  10/09/2021 1703   LABSPEC 1.004 (L) 10/09/2021 1703   PHURINE 6.0 10/09/2021 1703   GLUCOSEU NEGATIVE 10/09/2021 1703   HGBUR NEGATIVE 10/09/2021 1703   BILIRUBINUR NEGATIVE 10/09/2021 [place] 10/09/2021 1703   PROTEINUR NEGATIVE 10/09/2021 1703   NITRITE NEGATIVE 10/09/2021 1703   LEUKOCYTESUR NEGATIVE 10/09/2021 1703    Radiological Exams on Admission: DG Chest 2 View  Result Date: 10/09/2021 CLINICAL DATA:  Bilateral lower extremity swelling EXAM: CHEST - 2 VIEW COMPARISON:  None. FINDINGS: The cardiomediastinal silhouette is normal. There is no focal consolidation or pulmonary edema. There is no pleural effusion or pneumothorax. There is no acute osseous abnormality. IMPRESSION: No radiographic evidence of acute cardiopulmonary process. Electronically Signed   By: Valetta Mole M.D.   On: 10/09/2021 15:13   US Abdomen Complete  Result Date: 10/09/2021 CLINICAL DATA:  Fever, liver disease EXAM: ABDOMEN ULTRASOUND COMPLETE COMPARISON:  None. FINDINGS: Gallbladder: No gallstones or wall thickening visualized. No sonographic Murphy sign noted by sonographer. Common bile duct: Diameter: Normal caliber, 4 mm Liver: No focal lesion identified. Within normal limits in parenchymal echogenicity. Portal vein is patent on color Doppler imaging with normal direction of blood flow towards the liver. IVC: No abnormality visualized. Pancreas: Visualized portion unremarkable. Spleen: Size and appearance within normal limits. Right Kidney: Length: 10.3 cm. Echogenicity within normal limits. No mass or hydronephrosis visualized. Left Kidney: Length: 11.1 cm. Echogenicity within normal limits. No mass or hydronephrosis visualized. Abdominal aorta: No aneurysm visualized. Other findings: None. IMPRESSION: No acute findings. Electronically Signed   By: Rolm Baptise M.D.   On: 10/09/2021 18:26    EKG: Independently reviewed. No EKG on chart  Assessment/Plan Active Problems:   Fever   Gout    HTN (hypertension)   Hypokalemia   Bilateral leg edema   Anemia   Knee pain, bilateral  Fever - no clear source of fever. Viral gastroenteritis possible. With elevated lactic acid, left shift on differential and painful knee joints may have active infection. Plan Lab ordered: ESR, CRP, acute hepatitis panel - r/o vasculitis, occult infection  APAP 1g TID for fever and pain  F/u lactic acid, CBCD  Levaquin 750 mg po daily  2. Bilateral knee pain - does not look like gout. Knee films with nl joint space, w/o abnormality Plan APAP  Ketorolac for uncontrolled pain  3. Anemia - no report of bleeding or prior h/o anemia Plan Anemia panel  F/u CBCD  4. Hypokalemia - patient taking HCTZ possible source. Given 10 meq K IV in ED Plan 1/2NS with 20 meq K at 100cc/hr  K 42meq po bid x 2 doses   5. HTN- BP OK, will continue linsopril/hct  DVT prophylaxis: lovenox  Code Status: full code  Family Communication: patient asked that his SO not be called. He will talk with her.  Disposition Plan: home 24-48 hrs  Consults called: none  Admission status: obs    Adella Hare MD [facility] Pager [phone]  If 7PM-7AM, please contact night-coverage [email or web address] Password [facility]  10/09/2021, 7:44 PM

## 2021-10-09 NOTE — ED Notes (Signed)
Shawn Wolfe did not want uric acid level at this time

## 2021-10-09 NOTE — ED Notes (Signed)
MD at bedside. 

## 2021-10-09 NOTE — ED Triage Notes (Signed)
Pt comes ems with bilateral leg/knee/feet swelling starting about a day ago. Had to leave work early from n/v then next day woke up with the swelling and pain. Hasn't been able to walk for the one day-has been laying on couch. Also has a fever and denies any sore throat, cough, sob, cold symptoms. Pt states he had HTN episodes at home and was concerned that was what caused it. Also has hx of gout. No hx of CHF.

## 2021-10-09 NOTE — ED Notes (Signed)
First Nurse Note:  Pt to ED via [facility] from home for swelling in both knees and feet. No redness per EMS. Pt has hx/o gout, HBP, and ETOH abuse.   Temp- 101.1 HR- 90 BP- 131/52 CBG- 135 SpO2 100% on room air

## 2021-10-09 NOTE — ED Notes (Signed)
Pt transported to US via stretcher at this time.  

## 2021-10-10 ENCOUNTER — Encounter: Payer: Self-pay | Admitting: Internal Medicine

## 2021-10-10 ENCOUNTER — Observation Stay: Payer: BC Managed Care – PPO

## 2021-10-10 ENCOUNTER — Observation Stay
Admit: 2021-10-10 | Discharge: 2021-10-10 | Disposition: A | Discharge status: Home / Self Care | Payer: BC Managed Care – PPO | Attending: Family Medicine | Admitting: Family Medicine

## 2021-10-10 DIAGNOSIS — Z8249 Family history of ischemic heart disease and other diseases of the circulatory system: Secondary | ICD-10-CM | POA: Diagnosis not present

## 2021-10-10 DIAGNOSIS — F1010 Alcohol abuse, uncomplicated: Secondary | ICD-10-CM

## 2021-10-10 DIAGNOSIS — M25461 Effusion, right knee: Secondary | ICD-10-CM

## 2021-10-10 DIAGNOSIS — D509 Iron deficiency anemia, unspecified: Secondary | ICD-10-CM | POA: Diagnosis present

## 2021-10-10 DIAGNOSIS — R29898 Other symptoms and signs involving the musculoskeletal system: Secondary | ICD-10-CM | POA: Diagnosis not present

## 2021-10-10 DIAGNOSIS — M25561 Pain in right knee: Secondary | ICD-10-CM | POA: Diagnosis not present

## 2021-10-10 DIAGNOSIS — A084 Viral intestinal infection, unspecified: Secondary | ICD-10-CM | POA: Diagnosis present

## 2021-10-10 DIAGNOSIS — E860 Dehydration: Secondary | ICD-10-CM | POA: Diagnosis present

## 2021-10-10 DIAGNOSIS — E538 Deficiency of other specified B group vitamins: Secondary | ICD-10-CM | POA: Diagnosis present

## 2021-10-10 DIAGNOSIS — M1009 Idiopathic gout, multiple sites: Secondary | ICD-10-CM | POA: Insufficient documentation

## 2021-10-10 DIAGNOSIS — M10062 Idiopathic gout, left knee: Secondary | ICD-10-CM | POA: Diagnosis present

## 2021-10-10 DIAGNOSIS — Z882 Allergy status to sulfonamides status: Secondary | ICD-10-CM | POA: Diagnosis not present

## 2021-10-10 DIAGNOSIS — I10 Essential (primary) hypertension: Secondary | ICD-10-CM | POA: Diagnosis present

## 2021-10-10 DIAGNOSIS — Z20822 Contact with and (suspected) exposure to covid-19: Secondary | ICD-10-CM | POA: Diagnosis present

## 2021-10-10 DIAGNOSIS — E876 Hypokalemia: Secondary | ICD-10-CM | POA: Diagnosis present

## 2021-10-10 DIAGNOSIS — Z809 Family history of malignant neoplasm, unspecified: Secondary | ICD-10-CM | POA: Diagnosis not present

## 2021-10-10 DIAGNOSIS — R7989 Other specified abnormal findings of blood chemistry: Secondary | ICD-10-CM

## 2021-10-10 DIAGNOSIS — Z79899 Other long term (current) drug therapy: Secondary | ICD-10-CM | POA: Diagnosis not present

## 2021-10-10 DIAGNOSIS — M25462 Effusion, left knee: Secondary | ICD-10-CM

## 2021-10-10 DIAGNOSIS — R7401 Elevation of levels of liver transaminase levels: Secondary | ICD-10-CM | POA: Diagnosis present

## 2021-10-10 DIAGNOSIS — D696 Thrombocytopenia, unspecified: Secondary | ICD-10-CM | POA: Diagnosis present

## 2021-10-10 DIAGNOSIS — D563 Thalassemia minor: Secondary | ICD-10-CM | POA: Diagnosis present

## 2021-10-10 DIAGNOSIS — Z23 Encounter for immunization: Secondary | ICD-10-CM | POA: Diagnosis not present

## 2021-10-10 DIAGNOSIS — Z87891 Personal history of nicotine dependence: Secondary | ICD-10-CM | POA: Diagnosis not present

## 2021-10-10 DIAGNOSIS — M25562 Pain in left knee: Secondary | ICD-10-CM

## 2021-10-10 DIAGNOSIS — M10061 Idiopathic gout, right knee: Secondary | ICD-10-CM | POA: Diagnosis present

## 2021-10-10 LAB — HEPATIC FUNCTION PANEL
ALT: 44 U/L (ref 0–44)
AST: 77 U/L — ABNORMAL HIGH (ref 15–41)
Albumin: 3 g/dL — ABNORMAL LOW (ref 3.5–5.0)
Alkaline Phosphatase: 29 U/L — ABNORMAL LOW (ref 38–126)
Bilirubin, Direct: 0.8 mg/dL — ABNORMAL HIGH (ref 0.0–0.2)
Indirect Bilirubin: 2.9 mg/dL — ABNORMAL HIGH (ref 0.3–0.9)
Total Bilirubin: 3.7 mg/dL — ABNORMAL HIGH (ref 0.3–1.2)
Total Protein: 6.5 g/dL (ref 6.5–8.1)

## 2021-10-10 LAB — HEPATITIS PANEL, ACUTE
HCV Ab: NONREACTIVE
Hep A IgM: NONREACTIVE
Hep B C IgM: NONREACTIVE
Hepatitis B Surface Ag: NONREACTIVE

## 2021-10-10 LAB — CBC
HCT: 22.3 % — ABNORMAL LOW (ref 39.0–52.0)
Hemoglobin: 7.5 g/dL — ABNORMAL LOW (ref 13.0–17.0)
MCH: 24.7 pg — ABNORMAL LOW (ref 26.0–34.0)
MCHC: 33.6 g/dL (ref 30.0–36.0)
MCV: 73.4 fL — ABNORMAL LOW (ref 80.0–100.0)
Platelets: 168 10*3/uL (ref 150–400)
RBC: 3.04 MIL/uL — ABNORMAL LOW (ref 4.22–5.81)
RDW: 18.1 % — ABNORMAL HIGH (ref 11.5–15.5)
WBC: 3.8 10*3/uL — ABNORMAL LOW (ref 4.0–10.5)
nRBC: 0 % (ref 0.0–0.2)

## 2021-10-10 LAB — CBC WITH DIFFERENTIAL/PLATELET
Abs Immature Granulocytes: 0.02 10*3/uL (ref 0.00–0.07)
Basophils Absolute: 0 10*3/uL (ref 0.0–0.1)
Basophils Relative: 0 %
Eosinophils Absolute: 0 10*3/uL (ref 0.0–0.5)
Eosinophils Relative: 0 %
HCT: 23.5 % — ABNORMAL LOW (ref 39.0–52.0)
Hemoglobin: 7.5 g/dL — ABNORMAL LOW (ref 13.0–17.0)
Immature Granulocytes: 0 %
Lymphocytes Relative: 15 %
Lymphs Abs: 0.7 10*3/uL (ref 0.7–4.0)
MCH: 23.5 pg — ABNORMAL LOW (ref 26.0–34.0)
MCHC: 31.9 g/dL (ref 30.0–36.0)
MCV: 73.7 fL — ABNORMAL LOW (ref 80.0–100.0)
Monocytes Absolute: 0.4 10*3/uL (ref 0.1–1.0)
Monocytes Relative: 8 %
Neutro Abs: 3.5 10*3/uL (ref 1.7–7.7)
Neutrophils Relative %: 77 %
Platelets: 157 10*3/uL (ref 150–400)
RBC: 3.19 MIL/uL — ABNORMAL LOW (ref 4.22–5.81)
RDW: 18.1 % — ABNORMAL HIGH (ref 11.5–15.5)
WBC: 4.7 10*3/uL (ref 4.0–10.5)
nRBC: 0.4 % — ABNORMAL HIGH (ref 0.0–0.2)

## 2021-10-10 LAB — PATHOLOGIST SMEAR REVIEW

## 2021-10-10 LAB — SYNOVIAL CELL COUNT + DIFF, W/ CRYSTALS
Eosinophils-Synovial: 0 %
Lymphocytes-Synovial Fld: 2 %
Monocyte-Macrophage-Synovial Fluid: 2 %
Neutrophil, Synovial: 96 %
WBC, Synovial: 61969 /mm3 — ABNORMAL HIGH (ref 0–200)

## 2021-10-10 LAB — C-REACTIVE PROTEIN: CRP: 24.5 mg/dL — ABNORMAL HIGH (ref <1.0)

## 2021-10-10 LAB — BASIC METABOLIC PANEL
Anion gap: 6 (ref 5–15)
BUN: 9 mg/dL (ref 6–20)
CO2: 30 mmol/L (ref 22–32)
Calcium: 8.7 mg/dL — ABNORMAL LOW (ref 8.9–10.3)
Chloride: 97 mmol/L — ABNORMAL LOW (ref 98–111)
Creatinine, Ser: 0.93 mg/dL (ref 0.61–1.24)
GFR, Estimated: >60 mL/min (ref >60)
Glucose, Bld: 109 mg/dL — ABNORMAL HIGH (ref 70–99)
Potassium: 3.2 mmol/L — ABNORMAL LOW (ref 3.5–5.1)
Sodium: 133 mmol/L — ABNORMAL LOW (ref 135–145)

## 2021-10-10 LAB — VITAMIN B12: Vitamin B-12: 87 pg/mL — ABNORMAL LOW (ref 180–914)

## 2021-10-10 LAB — HIV ANTIBODY (ROUTINE TESTING W REFLEX): HIV Screen 4th Generation wRfx: NONREACTIVE

## 2021-10-10 LAB — CHLAMYDIA/NGC RT PCR (ARMC ONLY)
Chlamydia Tr: NOT DETECTED
N gonorrhoeae: NOT DETECTED

## 2021-10-10 MED ORDER — LORAZEPAM 2 MG/ML IJ SOLN: 1.0000 mg | INTRAMUSCULAR | Status: DC | PRN

## 2021-10-10 MED ORDER — COLCHICINE 0.6 MG PO TABS
0.6000 mg | ORAL_TABLET | Freq: Two times a day (BID) | ORAL | Status: DC
  Administered 2021-10-10 – 2021-10-11 (×3): 0.6 mg via ORAL
  Filled 2021-10-10 (×3): qty 1

## 2021-10-10 MED ORDER — FOLIC ACID 1 MG PO TABS
1.0000 mg | ORAL_TABLET | Freq: Every day | ORAL | Status: DC
  Administered 2021-10-10 – 2021-10-11 (×2): 1 mg via ORAL
  Filled 2021-10-10 (×2): qty 1

## 2021-10-10 MED ORDER — THIAMINE HCL 100 MG/ML IJ SOLN: 100.0000 mg | Freq: Every day | INTRAMUSCULAR | Status: DC

## 2021-10-10 MED ORDER — INFLUENZA VAC SPLIT QUAD 0.5 ML IM SUSY
0.5000 mL | PREFILLED_SYRINGE | INTRAMUSCULAR | Status: AC
  Administered 2021-10-11: 0.5 mL via INTRAMUSCULAR
  Filled 2021-10-10 (×2): qty 0.5

## 2021-10-10 MED ORDER — ADULT MULTIVITAMIN W/MINERALS CH
1.0000 | ORAL_TABLET | Freq: Every day | ORAL | Status: DC
  Administered 2021-10-10 – 2021-10-11 (×2): 1 via ORAL
  Filled 2021-10-10 (×2): qty 1

## 2021-10-10 MED ORDER — LORAZEPAM 1 MG PO TABS: 1.0000 mg | ORAL_TABLET | ORAL | Status: DC | PRN

## 2021-10-10 MED ORDER — THIAMINE HCL 100 MG PO TABS
100.0000 mg | ORAL_TABLET | Freq: Every day | ORAL | Status: DC
  Administered 2021-10-10 – 2021-10-11 (×2): 100 mg via ORAL
  Filled 2021-10-10 (×2): qty 1

## 2021-10-10 MED ORDER — DOXYCYCLINE HYCLATE 100 MG PO TABS
100.0000 mg | ORAL_TABLET | Freq: Two times a day (BID) | ORAL | Status: DC
  Administered 2021-10-10 – 2021-10-11 (×2): 100 mg via ORAL
  Filled 2021-10-10 (×2): qty 1

## 2021-10-10 MED ORDER — CYANOCOBALAMIN 1000 MCG/ML IJ SOLN
1000.0000 ug | INTRAMUSCULAR | Status: DC
Start: 2021-10-10 — End: 2021-10-11
  Administered 2021-10-10: 1000 ug via INTRAMUSCULAR
  Filled 2021-10-10: qty 1

## 2021-10-10 NOTE — Assessment & Plan Note (Addendum)
--   Secondary to gout, much improved with treatment and arthrocentesis left knee

## 2021-10-10 NOTE — Consult Note (Signed)
Infectious Disease     Reason for Consult:Fever, edema    Referring Physician: Dr Sarajane Jews Date of Admission:  10/09/2021   Principal Problem:   Polyarticular joint involvement Active Problems:   Gout   Benign essential HTN   Bilateral leg edema   Anemia   Elevated LFTs   Bilateral knee effusions   Alcohol abuse   B12 deficiency   HPI: Shawn Wolfe is a 45 y.o. male admitted 10/16 with complaints of nausea vomiting and diarrhea followed by fevers bilateral lower extremity swelling in his knees and leg pain.  He does have a history of hypertension and gout.  In the ED his temperature was 101.1.  Blood count was 7.9.  Labs were done and showed hypokalemia, mild elevation in his AST, elevation of his bilirubin to 3.7.  CRP was 24.5.  D-dimer is 0.53 just above normal.  His sed rate was 54.  He was tested for COVID and flu and was negative.  Hepatitis A, B, and C testing were negative.  Urine sample did not show any protein or evidence of infection.  Blood cultures have been done and are pending.  He has had anemia as well with a hemoglobin of 9.7 and his platelets were down to 147.  He had lymphopenia with 7 absolute lymphocyte count of 0.6. Imaging of the chest showed no findings.  Abdominal ultrasound was also negative.  He has had x-rays of both knees that showed moderate to large joint effusions.  On the left.  On the right there was a small joint effusion.  Echocardiogram has been done but is not resulted yet.  On review of prior medical issues in December 2021 he presented with right knee pain and left forearm pain for about a week.  He was diagnosed with gout at 1 point but no crystal analysis was done.  He was treated with prednisone and colchicine.  He had aspiration L knee today with 62K wbc 96% PMN and + gouty crystals.   He reports he has lost 20#s in last few months through diet. Denies any prior fevers, chills, NS. He works as Freight forwarder at [employer]. Lives with GF. No children.  Has pet dogs but no other animal exposure. He has some outdoor exposure but no tick bites recently. He drinks 6 beers a day, no tob, no other substances.  Past Medical History:  Diagnosis Date   Gout    Hypertension    Past Surgical History:  Procedure Laterality Date   HAND SURGERY Right    mutiple fractures requiring ORIF - pins later removed.   Social History   Tobacco Use   Smoking status: Never   Smokeless tobacco: Current  Vaping Use   Vaping Use: Never used  Substance Use Topics   Alcohol use: Yes    Alcohol/week: 42.0 standard drinks    Types: 42 Cans of beer per week    Comment: 6 cans of beer/day   Drug use: Never   Family History  Problem Relation Age of Onset   Hypertension Mother    Liver disease Mother    Cancer Father    Cancer Sister    Hypertension Brother     Allergies:  Allergies  Allergen Reactions   Sulfa Antibiotics Hives    Current antibiotics: Antibiotics Given (last 72 hours)     Date/Time Action Medication Dose   10/09/21 2313 Given   levofloxacin (LEVAQUIN) tablet 750 mg 750 mg   10/10/21 1007 Given   levofloxacin (  LEVAQUIN) tablet 750 mg 750 mg       MEDICATIONS:  acetaminophen  1,000 mg Oral TID   colchicine  0.6 mg Oral BID   cyanocobalamin  1,000 mcg Intramuscular Q7 days   enoxaparin (LOVENOX) injection  40 mg Subcutaneous F74B   folic acid  1 mg Oral Daily   hydrochlorothiazide  12.5 mg Oral Daily   [START ON 10/11/2021] influenza vac split quadrivalent PF  0.5 mL Intramuscular Tomorrow-1000   levofloxacin  750 mg Oral Daily   lisinopril  20 mg Oral Daily   multivitamin with minerals  1 tablet Oral Daily   potassium chloride  20 mEq Oral BID   thiamine  100 mg Oral Daily   Or   thiamine  100 mg Intravenous Daily    Review of Systems - 11 systems reviewed and negative per HPI   OBJECTIVE: Temp:  [97.7 F (36.5 C)-101.1 F (38.4 C)] 98.6 F (37 C) (10/17 0827) Pulse Rate:  [63-99] 88 (10/17 1005) Resp:   [11-20] 20 (10/17 0325) BP: (92-141)/(58-97) 110/64 (10/17 1005) SpO2:  [98 %-100 %] 98 % (10/17 1005) Weight:  [78 kg-81.5 kg] 81.5 kg (10/17 0325) Physical Exam  Constitutional: He is oriented to person, place, and time. He appears well-developed and well-nourished. No distress.  HENT: mild icterus Mouth/Throat: Oropharynx is clear and moist. No oropharyngeal exudate.  Cardiovascular: Normal rate, regular rhythm and normal heart sounds. Exam reveals no gallop and no friction rub.  No murmur heard.  Pulmonary/Chest: Effort normal and breath sounds normal. No respiratory distress. He has no wheezes.  Abdominal: Soft. Bowel sounds are normal. He exhibits no distension. There is no tenderness.  Lymphadenopathy:  He has no cervical adenopathy.  Neurological: He is alert and oriented to person, place, and time.  Skin: Skin is warm and dry. He has several areas or mild erythema on upper arm. Psychiatric: He has a normal mood and affect. His behavior is normal.   Ext - L knee with mod swelling, able to bend R knee with swelling and mild warmth   LABS: Results for orders placed or performed during the hospital encounter of 10/09/21 (from the past 48 hour(s))  Comprehensive metabolic panel     Status: Abnormal   Collection Time: 10/09/21  2:28 PM  Result Value Ref Range   Sodium 133 (L) 135 - 145 mmol/L   Potassium 2.9 (L) 3.5 - 5.1 mmol/L   Chloride 92 (L) 98 - 111 mmol/L   CO2 29 22 - 32 mmol/L   Glucose, Bld 121 (H) 70 - 99 mg/dL    Comment: Glucose reference range applies only to samples taken after fasting for at least 8 hours.   BUN 6 6 - 20 mg/dL   Creatinine, Ser 0.80 0.61 - 1.24 mg/dL   Calcium 9.5 8.9 - 10.3 mg/dL   Total Protein 8.0 6.5 - 8.1 g/dL   Albumin 4.0 3.5 - 5.0 g/dL   AST 94 (H) 15 - 41 U/L   ALT 35 0 - 44 U/L   Alkaline Phosphatase 33 (L) 38 - 126 U/L   Total Bilirubin 3.6 (H) 0.3 - 1.2 mg/dL   GFR, Estimated >60 >60 mL/min    Comment: (NOTE) Calculated using  the CKD-EPI Creatinine Equation (2021)    Anion gap 12 5 - 15    Comment: Performed at [facility], [address] [place], [place] [address]  Lactic acid, plasma     Status: None   Collection Time: 10/09/21  2:28 PM  Result Value Ref Range   Lactic Acid, Venous 1.7 0.5 - 1.9 mmol/L    Comment: Performed at [facility], [address] [place], [place] [address]  CBC with Differential     Status: Abnormal   Collection Time: 10/09/21  2:28 PM  Result Value Ref Range   WBC 7.9 4.0 - 10.5 K/uL   RBC 4.08 (L) 4.22 - 5.81 MIL/uL   Hemoglobin 9.7 (L) 13.0 - 17.0 g/dL   HCT 92.2 (L) 94.1 - 36.7 %   MCV 72.8 (L) 80.0 - 100.0 fL   MCH 23.8 (L) 26.0 - 34.0 pg   MCHC 32.7 30.0 - 36.0 g/dL   RDW 37.2 (H) 84.3 - 10.8 %   Platelets 147 (L) 150 - 400 K/uL    Comment: PLATELET CLUMPS NOTED ON SMEAR, UNABLE TO ESTIMATE   nRBC 0.3 (H) 0.0 - 0.2 %   Neutrophils Relative % 84 %   Neutro Abs 6.6 1.7 - 7.7 K/uL   Lymphocytes Relative 8 %   Lymphs Abs 0.6 (L) 0.7 - 4.0 K/uL   Monocytes Relative 7 %   Monocytes Absolute 0.6 0.1 - 1.0 K/uL   Eosinophils Relative 0 %   Eosinophils Absolute 0.0 0.0 - 0.5 K/uL   Basophils Relative 0 %   Basophils Absolute 0.0 0.0 - 0.1 K/uL   WBC Morphology MORPHOLOGY UNREMARKABLE    RBC Morphology MIXED RBC POPULATION    Immature Granulocytes 1 %   Abs Immature Granulocytes 0.05 0.00 - 0.07 K/uL   Polychromasia PRESENT     Comment: Performed at [facility], [address] [place], [place] [address]  Protime-INR     Status: None   Collection Time: 10/09/21  2:28 PM  Result Value Ref Range   Prothrombin Time 13.6 11.4 - 15.2 seconds   INR 1.0 0.8 - 1.2    Comment: (NOTE) INR goal varies based on device and disease states. Performed at [facility], [address] [place], [place] [address]   Culture, blood (Routine x 2)     Status: None (Preliminary result)   Collection Time: 10/09/21  2:28 PM   Specimen: BLOOD   Result Value Ref Range   Specimen Description BLOOD RIGHT ANTECUBITAL    Special Requests      BOTTLES DRAWN AEROBIC AND ANAEROBIC Blood Culture results may not be optimal due to an inadequate volume of blood received in culture bottles   Culture      NO GROWTH < 24 HOURS Performed at [facility], [address] [place], [place] [address]    Report Status PENDING   Lipase, blood     Status: None   Collection Time: 10/09/21  2:28 PM  Result Value Ref Range   Lipase 24 11 - 51 U/L    Comment: Performed at [facility], [address] [place], [place] [address]  Reticulocytes     Status: Abnormal   Collection Time: 10/09/21  2:28 PM  Result Value Ref Range   Retic Ct Pct 2.4 0.4 - 3.1 %   RBC. 4.09 (L) 4.22 - 5.81 MIL/uL   Retic Count, Absolute 99.6 19.0 - 186.0 K/uL   Immature Retic Fract 29.8 (H) 2.3 - 15.9 %    Comment: Performed at [facility], [address], [place], [place] [address]  Sedimentation rate     Status: Abnormal   Collection Time: 10/09/21  2:28 PM  Result Value Ref Range  Sed Rate 54 (H) 0 - 15 mm/hr    Comment: Performed at [facility], [address] [place], [place] [address]  Uric acid     Status: None   Collection Time: 10/09/21  2:28 PM  Result Value Ref Range   Uric Acid, Serum 8.3 3.7 - 8.6 mg/dL    Comment: Performed at [facility], [address] [place], [place] [address]  Lactic acid, plasma     Status: None   Collection Time: 10/09/21  5:03 PM  Result Value Ref Range   Lactic Acid, Venous 1.3 0.5 - 1.9 mmol/L    Comment: Performed at [facility], [address] [place], [place] [address]  Culture, blood (Routine x 2)     Status: None (Preliminary result)   Collection Time: 10/09/21  5:03 PM   Specimen: BLOOD  Result Value Ref Range   Specimen Description BLOOD LEFT ANTECUBITAL    Special Requests      BOTTLES DRAWN AEROBIC AND ANAEROBIC Blood Culture adequate volume    Culture      NO GROWTH < 12 HOURS Performed at [facility], [address], [place], [place] [address]    Report Status PENDING   Urinalysis, Complete w Microscopic Urine, Random     Status: Abnormal   Collection Time: 10/09/21  5:03 PM  Result Value Ref Range   Color, Urine YELLOW (A) YELLOW   APPearance CLEAR (A) CLEAR   Specific Gravity, Urine 1.004 (L) 1.005 - 1.030   pH 6.0 5.0 - 8.0   Glucose, UA NEGATIVE NEGATIVE mg/dL   Hgb urine dipstick NEGATIVE NEGATIVE   Bilirubin Urine NEGATIVE NEGATIVE   Ketones, ur NEGATIVE NEGATIVE mg/dL   Protein, ur NEGATIVE NEGATIVE mg/dL   Nitrite NEGATIVE NEGATIVE   Leukocytes,Ua NEGATIVE NEGATIVE   RBC / HPF 0-5 0 - 5 RBC/hpf   WBC, UA 0-5 0 - 5 WBC/hpf   Bacteria, UA NONE SEEN NONE SEEN   Squamous Epithelial / LPF NONE SEEN 0 - 5    Comment: Performed at [facility], [address], [place], [place] [address]  Brain natriuretic peptide     Status: Abnormal   Collection Time: 10/09/21  5:03 PM  Result Value Ref Range   B Natriuretic Peptide 184.2 (H) 0.0 - 100.0 pg/mL    Comment: Performed at [facility], [address] [place], [place] [address]  Fibrinogen     Status: Abnormal   Collection Time: 10/09/21  5:03 PM  Result Value Ref Range   Fibrinogen >800 (H) 210 - 475 mg/dL    Comment: (NOTE) Fibrinogen results may be underestimated in patients receiving thrombolytic therapy. Performed at [facility], [address] [place], [place] [address]   Magnesium     Status: None   Collection Time: 10/09/21  5:03 PM  Result Value Ref Range   Magnesium 1.8 1.7 - 2.4 mg/dL    Comment: Performed at [facility], [address] [place],  [address]  Resp Panel by RT-PCR (Flu A&B, Covid) Nasopharyngeal Swab     Status: None   Collection Time: 10/09/21  5:03 PM   Specimen: Nasopharyngeal Swab; Nasopharyngeal(NP) swabs in vial transport medium  Result Value Ref Range   SARS  Coronavirus 2 by RT PCR NEGATIVE NEGATIVE    Comment: (NOTE) SARS-CoV-2 target nucleic acids are NOT DETECTED.  The SARS-CoV-2 RNA is generally detectable in upper respiratory specimens during the acute phase of infection. The lowest  concentration of SARS-CoV-2 viral copies this assay can detect is 138 copies/mL. A negative result does not preclude SARS-Cov-2 infection and should not be used as the sole basis for treatment or other patient management decisions. A negative result may occur with  improper specimen collection/handling, submission of specimen other than nasopharyngeal swab, presence of viral mutation(s) within the areas targeted by this assay, and inadequate number of viral copies(<138 copies/mL). A negative result must be combined with clinical observations, patient history, and epidemiological information. The expected result is Negative.  Fact Sheet for Patients:  BloggerCourse.com  Fact Sheet for Healthcare Providers:  SeriousBroker.it  This test is no t yet approved or cleared by the [place] FDA and  has been authorized for detection and/or diagnosis of SARS-CoV-2 by FDA under an Emergency Use Authorization (EUA). This EUA will remain  in effect (meaning this test can be used) for the duration of the COVID-19 declaration under Section 564(b)(1) of the Act, 21 U.S.C.section 360bbb-3(b)(1), unless the authorization is terminated  or revoked sooner.       Influenza A by PCR NEGATIVE NEGATIVE   Influenza B by PCR NEGATIVE NEGATIVE    Comment: (NOTE) The Xpert Xpress SARS-CoV-2/FLU/RSV plus assay is intended as an aid in the diagnosis of influenza from Nasopharyngeal swab specimens and should not be used as a sole basis for treatment. Nasal washings and aspirates are unacceptable for Xpert Xpress SARS-CoV-2/FLU/RSV testing.  Fact Sheet for Patients: BloggerCourse.com  Fact Sheet  for Healthcare Providers: SeriousBroker.it  This test is not yet approved or cleared by the [place] FDA and has been authorized for detection and/or diagnosis of SARS-CoV-2 by FDA under an Emergency Use Authorization (EUA). This EUA will remain in effect (meaning this test can be used) for the duration of the COVID-19 declaration under Section 564(b)(1) of the Act, 21 U.S.C. section 360bbb-3(b)(1), unless the authorization is terminated or revoked.  Performed at [facility], [address] [place], [place] [address]   D-dimer, quantitative     Status: Abnormal   Collection Time: 10/09/21  5:03 PM  Result Value Ref Range   D-Dimer, Quant 0.53 (H) 0.00 - 0.50 ug/mL-FEU    Comment: (NOTE) At the manufacturer cut-off value of 0.5 g/mL FEU, this assay has a negative predictive value of 95-100%.This assay is intended for use in conjunction with a clinical pretest probability (PTP) assessment model to exclude pulmonary embolism (PE) and deep venous thrombosis (DVT) in outpatients suspected of PE or DVT. Results should be correlated with clinical presentation. Performed at [facility], [address] [place], [place] [address]   Hepatitis panel, acute     Status: None   Collection Time: 10/09/21  5:03 PM  Result Value Ref Range   Hepatitis B Surface Ag NON REACTIVE NON REACTIVE   HCV Ab NON REACTIVE NON REACTIVE    Comment: (NOTE) Nonreactive HCV antibody screen is consistent with no HCV infections,  unless recent infection is suspected or other evidence exists to indicate HCV infection.     Hep A IgM NON REACTIVE NON REACTIVE   Hep B C IgM NON REACTIVE NON REACTIVE    Comment: Performed at [facility], [address], [place], [place] [address]  Bilirubin, fractionated(tot/dir/indir)     Status: Abnormal   Collection Time: 10/09/21  5:03 PM  Result Value Ref Range   Total Bilirubin 3.7 (H) 0.3 - 1.2 mg/dL    Bilirubin, Direct 0.6 (H) 0.0 - 0.2 mg/dL  Indirect Bilirubin 3.1 (H) 0.3 - 0.9 mg/dL    Comment: Performed at [facility], [address] [place], [place] [address]  Ferritin     Status: Abnormal   Collection Time: 10/09/21  5:03 PM  Result Value Ref Range   Ferritin 392 (H) 24 - 336 ng/mL    Comment: Performed at [facility], [address] [place], [place] [address]  Folate     Status: None   Collection Time: 10/09/21  5:03 PM  Result Value Ref Range   Folate 7.0 >5.9 ng/mL    Comment: Performed at [facility] [place], [place], [place] [address]  Iron and TIBC     Status: Abnormal   Collection Time: 10/09/21  5:03 PM  Result Value Ref Range   Iron 10 (L) 45 - 182 ug/dL   TIBC 330 250 - 450 ug/dL   Saturation Ratios 3 (L) 17.9 - 39.5 %   UIBC 320 ug/dL    Comment: Performed at [facility], [address], [place], [place] [address]  Basic metabolic panel     Status: Abnormal   Collection Time: 10/10/21  4:51 AM  Result Value Ref Range   Sodium 133 (L) 135 - 145 mmol/L   Potassium 3.2 (L) 3.5 - 5.1 mmol/L   Chloride 97 (L) 98 - 111 mmol/L   CO2 30 22 - 32 mmol/L   Glucose, Bld 109 (H) 70 - 99 mg/dL    Comment: Glucose reference range applies only to samples taken after fasting for at least 8 hours.   BUN 9 6 - 20 mg/dL   Creatinine, Ser 0.93 0.61 - 1.24 mg/dL   Calcium 8.7 (L) 8.9 - 10.3 mg/dL   GFR, Estimated >60 >60 mL/min    Comment: (NOTE) Calculated using the CKD-EPI Creatinine Equation (2021)    Anion gap 6 5 - 15    Comment: Performed at [facility], [address] [place], [place] [address]  CBC with Differential/Platelet     Status: Abnormal   Collection Time: 10/10/21  4:51 AM  Result Value Ref Range   WBC 4.7 4.0 - 10.5 K/uL   RBC 3.19 (L) 4.22 - 5.81 MIL/uL   Hemoglobin 7.5 (L) 13.0 - 17.0 g/dL    Comment: Reticulocyte Hemoglobin testing may be clinically indicated, consider ordering this  additional test [id number]    HCT 23.5 (L) 39.0 - 52.0 %   MCV 73.7 (L) 80.0 - 100.0 fL   MCH 23.5 (L) 26.0 - 34.0 pg   MCHC 31.9 30.0 - 36.0 g/dL   RDW 18.1 (H) 11.5 - 15.5 %   Platelets 157 150 - 400 K/uL   nRBC 0.4 (H) 0.0 - 0.2 %   Neutrophils Relative % 77 %   Neutro Abs 3.5 1.7 - 7.7 K/uL   Lymphocytes Relative 15 %   Lymphs Abs 0.7 0.7 - 4.0 K/uL   Monocytes Relative 8 %   Monocytes Absolute 0.4 0.1 - 1.0 K/uL   Eosinophils Relative 0 %   Eosinophils Absolute 0.0 0.0 - 0.5 K/uL   Basophils Relative 0 %   Basophils Absolute 0.0 0.0 - 0.1 K/uL   Immature Granulocytes 0 %   Abs Immature Granulocytes 0.02 0.00 - 0.07 K/uL    Comment: Performed at [facility], [address], [place],  [address]  Hepatic function panel     Status: Abnormal   Collection Time: 10/10/21  4:51 AM  Result Value Ref  Range   Total Protein 6.5 6.5 - 8.1 g/dL   Albumin 3.0 (L) 3.5 - 5.0 g/dL   AST 77 (H) 15 - 41 U/L   ALT 44 0 - 44 U/L   Alkaline Phosphatase 29 (L) 38 - 126 U/L   Total Bilirubin 3.7 (H) 0.3 - 1.2 mg/dL   Bilirubin, Direct 0.8 (H) 0.0 - 0.2 mg/dL   Indirect Bilirubin 2.9 (H) 0.3 - 0.9 mg/dL    Comment: Performed at [facility], [address] [place], [place] [address]  Vitamin B12     Status: Abnormal   Collection Time: 10/10/21  4:51 AM  Result Value Ref Range   Vitamin B-12 87 (L) 180 - 914 pg/mL    Comment: (NOTE) This assay is not validated for testing neonatal or myeloproliferative syndrome specimens for Vitamin B12 levels. Performed at [facility], [place] [address], [place], [place] [address]   C-reactive protein     Status: Abnormal   Collection Time: 10/10/21  4:51 AM  Result Value Ref Range   CRP 24.5 (H) <1.0 mg/dL    Comment: Performed at [place] [address] [place], [place] [address]  CBC     Status: Abnormal   Collection Time: 10/10/21 11:58 AM  Result Value Ref Range   WBC 3.8 (L) 4.0 - 10.5 K/uL   RBC 3.04  (L) 4.22 - 5.81 MIL/uL   Hemoglobin 7.5 (L) 13.0 - 17.0 g/dL    Comment: Reticulocyte Hemoglobin testing may be clinically indicated, consider ordering this additional test [id number]    HCT 22.3 (L) 39.0 - 52.0 %   MCV 73.4 (L) 80.0 - 100.0 fL   MCH 24.7 (L) 26.0 - 34.0 pg   MCHC 33.6 30.0 - 36.0 g/dL   RDW 18.1 (H) 11.5 - 15.5 %   Platelets 168 150 - 400 K/uL   nRBC 0.0 0.0 - 0.2 %    Comment: Performed at [facility], [address] [place], [place] [address]   No components found for: ESR, C REACTIVE PROTEIN MICRO: Recent Results (from the past 720 hour(s))  Culture, blood (Routine x 2)     Status: None (Preliminary result)   Collection Time: 10/09/21  2:28 PM   Specimen: BLOOD  Result Value Ref Range Status   Specimen Description BLOOD RIGHT ANTECUBITAL  Final   Special Requests   Final    BOTTLES DRAWN AEROBIC AND ANAEROBIC Blood Culture results may not be optimal due to an inadequate volume of blood received in culture bottles   Culture   Final    NO GROWTH < 24 HOURS Performed at [facility], [address] [place], [place] [address]    Report Status PENDING  Incomplete  Culture, blood (Routine x 2)     Status: None (Preliminary result)   Collection Time: 10/09/21  5:03 PM   Specimen: BLOOD  Result Value Ref Range Status   Specimen Description BLOOD LEFT ANTECUBITAL  Final   Special Requests   Final    BOTTLES DRAWN AEROBIC AND ANAEROBIC Blood Culture adequate volume   Culture   Final    NO GROWTH < 12 HOURS Performed at [facility], [address] [place],  [address]    Report Status PENDING  Incomplete  Resp Panel by RT-PCR (Flu A&B, Covid) Nasopharyngeal Swab     Status: None   Collection Time: 10/09/21  5:03 PM   Specimen: Nasopharyngeal Swab; Nasopharyngeal(NP) swabs in vial  transport medium  Result Value Ref Range Status   SARS Coronavirus 2 by RT PCR NEGATIVE NEGATIVE Final    Comment: (NOTE) SARS-CoV-2 target  nucleic acids are NOT DETECTED.  The SARS-CoV-2 RNA is generally detectable in upper respiratory specimens during the acute phase of infection. The lowest concentration of SARS-CoV-2 viral copies this assay can detect is 138 copies/mL. A negative result does not preclude SARS-Cov-2 infection and should not be used as the sole basis for treatment or other patient management decisions. A negative result may occur with  improper specimen collection/handling, submission of specimen other than nasopharyngeal swab, presence of viral mutation(s) within the areas targeted by this assay, and inadequate number of viral copies(<138 copies/mL). A negative result must be combined with clinical observations, patient history, and epidemiological information. The expected result is Negative.  Fact Sheet for Patients:  BloggerCourse.com  Fact Sheet for Healthcare Providers:  SeriousBroker.it  This test is no t yet approved or cleared by the [place] FDA and  has been authorized for detection and/or diagnosis of SARS-CoV-2 by FDA under an Emergency Use Authorization (EUA). This EUA will remain  in effect (meaning this test can be used) for the duration of the COVID-19 declaration under Section 564(b)(1) of the Act, 21 U.S.C.section 360bbb-3(b)(1), unless the authorization is terminated  or revoked sooner.       Influenza A by PCR NEGATIVE NEGATIVE Final   Influenza B by PCR NEGATIVE NEGATIVE Final    Comment: (NOTE) The Xpert Xpress SARS-CoV-2/FLU/RSV plus assay is intended as an aid in the diagnosis of influenza from Nasopharyngeal swab specimens and should not be used as a sole basis for treatment. Nasal washings and aspirates are unacceptable for Xpert Xpress SARS-CoV-2/FLU/RSV testing.  Fact Sheet for Patients: BloggerCourse.com  Fact Sheet for Healthcare  Providers: SeriousBroker.it  This test is not yet approved or cleared by the [place] FDA and has been authorized for detection and/or diagnosis of SARS-CoV-2 by FDA under an Emergency Use Authorization (EUA). This EUA will remain in effect (meaning this test can be used) for the duration of the COVID-19 declaration under Section 564(b)(1) of the Act, 21 U.S.C. section 360bbb-3(b)(1), unless the authorization is terminated or revoked.  Performed at [facility], [address] [place], [place] [address]     IMAGING: [place] Chest 2 View  Result Date: 10/09/2021 CLINICAL DATA:  Bilateral lower extremity swelling EXAM: CHEST - 2 VIEW COMPARISON:  None. FINDINGS: The cardiomediastinal silhouette is normal. There is no focal consolidation or pulmonary edema. There is no pleural effusion or pneumothorax. There is no acute osseous abnormality. IMPRESSION: No radiographic evidence of acute cardiopulmonary process. Electronically Signed   By: Lesia Hausen M.D.   On: 10/09/2021 15:13   DG Knee 1-2 Views Left  Result Date: 10/09/2021 CLINICAL DATA:  Bilateral lower extremity swelling EXAM: LEFT KNEE - 1-2 VIEW COMPARISON:  None. FINDINGS: Moderate to large joint effusion. No acute bony abnormality. Specifically, no fracture, subluxation, or dislocation. Joint spaces maintained. IMPRESSION: Moderate to large joint effusion.  No acute bony abnormality. Electronically Signed   By: Charlett Nose M.D.   On: 10/09/2021 19:49   DG Knee 1-2 Views Right  Result Date: 10/09/2021 CLINICAL DATA:  Lower extremity swelling EXAM: RIGHT KNEE - 1-2 VIEW COMPARISON:  None. FINDINGS: Small joint effusion. No acute bony abnormality. Specifically, no fracture, subluxation, or dislocation. Joint spaces maintained. Soft tissues are intact. IMPRESSION: Small joint effusion.  No acute bony abnormality. Electronically Signed  By: Rolm Baptise M.D.   On: 10/09/2021 19:49   US Abdomen  Complete  Result Date: 10/09/2021 CLINICAL DATA:  Fever, liver disease EXAM: ABDOMEN ULTRASOUND COMPLETE COMPARISON:  None. FINDINGS: Gallbladder: No gallstones or wall thickening visualized. No sonographic Murphy sign noted by sonographer. Common bile duct: Diameter: Normal caliber, 4 mm Liver: No focal lesion identified. Within normal limits in parenchymal echogenicity. Portal vein is patent on color Doppler imaging with normal direction of blood flow towards the liver. IVC: No abnormality visualized. Pancreas: Visualized portion unremarkable. Spleen: Size and appearance within normal limits. Right Kidney: Length: 10.3 cm. Echogenicity within normal limits. No mass or hydronephrosis visualized. Left Kidney: Length: 11.1 cm. Echogenicity within normal limits. No mass or hydronephrosis visualized. Abdominal aorta: No aneurysm visualized. Other findings: None. IMPRESSION: No acute findings. Electronically Signed   By: Rolm Baptise M.D.   On: 10/09/2021 18:26    Assessment:   Shawn Wolfe is a 45 y.o. male with a hx of HTN and prior presumed gout, recurrent joint pain over last year now with an episode of NV and diarrhea - short lived- followed by febrile illness, with  LE joint swelling ans pain in bil knees and ankles.   He has a nml wbc but mild lymhopenia, has marked anemia and mild TCP.  Mild elevation in his AST with more marked elevation in his bilirubin.  Hepatitis and HIV testing is negative.  Urinalysis is normal.  His ferritin is elevated at 392.  B12 is low in the 80s.  He does seem to have iron deficiency as well on iron panel.  Ultrasound his abdomen and chest x-ray are normal.  Echocardiogram is pending.  ESR 54 and CRP 24.5 are elevated.  Joint aspiration with 61K wbc 96% PMN and gouty crystals  Perplexing case. Gout can certainly cause the swelling and fevers but no obvious cause of his mod- severe anemia.   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 We have no prior labs on him although  he says has had some done at [facility]. His diarrhea has resolved. Recommendations Check urine gonorrhea chlamydia. Stop levofloxacin Start doxy in case tick borne illness but doubt.  Thank you very much for allowing me to participate in the care of this patient. Please call with questions.   Cheral Marker. Ola Spurr, MD

## 2021-10-10 NOTE — Progress Notes (Signed)
Progress Note  Shawn Wolfe   QIW:[id number]  DOB: 07-01-1976  DOA: 10/09/2021     0 Date of Service: 10/10/2021   Clinical Course 45 year old man with PMH essential hypertension, alcohol use daily, self diagnosed gout, presented with history of fever, nausea, vomiting, diarrhea, bilateral knee swelling and ankle swelling. --10/16 admitted for fever, polyarticular joint pain, knee effusions and pedal edema, anemia --10/17 still has significant edema, consult rheumatology for diagnostic arthrocentesis, rule out gout, rule out infection, consult ED for evaluation  Assessment and Plan * Polyarticular joint involvement -- Etiology unclear, patient presented with history of fever, nausea, vomiting, diarrhea, previous history of gout in knees and feet, however never had swelling or multijoint involvement before -- Appears clinically stable, multiple labs pending, differential wide, consult rheumatology for diagnostic arthrocentesis and further recommendations, rule out crystal phenomenon, rule out infection.  Consulted infectious disease for opinion given constellation of signs of symptoms and lack of unifying diagnosis.  Bilateral knee effusions -- Doubt septic arthritis, rheumatology consultation for diagnostic arthrocentesis, consider crystal disease or other inflammatory polyarthropathy  Gout --Presumed diagnosis based on patient's clinical history, never had arthrocentesis, seen in the emergency department twice last year for unilateral knee swelling --Will check uric acid, colchicine, follow-up arthrocentesis -- Dietitian consultation  Alcohol abuse --CIWA --TOC consultation  Elevated LFTs -- AST and total bilirubin, consistent with history of significant alcohol intake.  Hepatitis panel negative.  Anemia -- Unclear whether this is related to other symptoms, patient with no history of anemia, will discuss with PCP for any recent lab work.  Check peripheral smear.  Check anemia  panel.  Bilateral leg edema -- Unusual swelling of both feet for gout, question other etiologies, check echocardiogram  B12 deficiency --replete  Benign essential HTN -- Blood pressure is borderline low here, unusual for patient he usually runs high, will hold hydrochlorothiazide and lisinopril for now  Subjective:  Feels somewhat better today although still has significant left knee swelling and pain, unable to fully extend or bend, still has significant pain in feet.  No fever, no nausea vomiting or diarrhea since Saturday.  Objective Vitals:   10/10/21 0220 10/10/21 0325 10/10/21 0827 10/10/21 1005  BP: 92/62 121/77 (!) 93/58 110/64  Pulse: 63 71 99 88  Resp: 11 20    Temp: 99 F (37.2 C) 97.7 F (36.5 C) 98.6 F (37 C)   TempSrc: Oral Oral Oral   SpO2: 98% 100% 98% 98%  Weight:  81.5 kg    Height:  5\' 7"  (1.702 m)     81.5 kg  Vital signs were reviewed and unremarkable except for:   Blood pressure borderline low, patient reports typically blood pressure is high.  Exam Physical Exam Constitutional:      General: He is not in acute distress.    Appearance: He is not ill-appearing or toxic-appearing.  Cardiovascular:     Rate and Rhythm: Normal rate and regular rhythm.     Heart sounds: No murmur heard. Pulmonary:     Effort: No respiratory distress.     Breath sounds: No wheezing, rhonchi or rales.  Musculoskeletal:     Right lower leg: Edema present.     Left lower leg: Edema present.  Skin:    Comments: Overall skin appears unremarkable of the knees and feet.  There is no erythema of the feet or knees.  No signs or symptoms to suggest acute infection.  Neurological:     Mental Status: He is alert.  Psychiatric:  Mood and Affect: Mood normal.        Behavior: Behavior normal.    Labs / Other Information My review of labs, imaging, notes and other tests is significant for    potassium 3.2, AST down to 77 total bilirubin stable 3.7, CRP elevated, vitamin  B12 low, hemoglobin down to 7.5   Time spent: 35 minutes [facility] 10/10/2021, 11:47 AM

## 2021-10-10 NOTE — Assessment & Plan Note (Addendum)
--   Blood pressure stable, continue lisinopril will increase dose since stopping HCTZ (hyperuricemia)

## 2021-10-10 NOTE — Assessment & Plan Note (Addendum)
--   Both knees, both ankles.  Appreciate rheumatology evaluation, discussed with Dr. Gavin Potters yesterday.  Arthrocentesis left knee was confirmatory with monosodium urate crystals.  Patient treated with steroid and colchicine, with plans for steroid taper as outpatient, stop colchicine when flare resolved.  Can use colchicine as needed for flares.  Educated on diet, connection to alcohol and recommendation for abstinence.  Recommend considering instituting allopurinol after flare resolves.

## 2021-10-10 NOTE — Assessment & Plan Note (Addendum)
--  Presumed diagnosis based on patient's clinical history, never had arthrocentesis, seen in the emergency department twice last year for unilateral knee swelling --Will check uric acid, colchicine, follow-up arthrocentesis -- Dietitian consultation

## 2021-10-10 NOTE — Progress Notes (Signed)
*  PRELIMINARY RESULTS* Echocardiogram 2D Echocardiogram has been performed.  Cristela Blue 10/10/2021, 1:43 PM

## 2021-10-10 NOTE — Assessment & Plan Note (Addendum)
--   Stable, no evidence of acute bleeding.  Iron supplementation, outpatient referral to gastroenterology for consideration of colonoscopy.

## 2021-10-10 NOTE — Assessment & Plan Note (Addendum)
--   Improving.  Secondary to gout.  Echocardiogram unremarkable.  No evidence of lower extremity DVT.  Expect resolution with treatment of gout.

## 2021-10-10 NOTE — Assessment & Plan Note (Addendum)
--   AST and total bilirubin, consistent with history of significant alcohol intake.  Hepatitis panel negative.  Consider repeat CMP in the next week or 2 to document resolution

## 2021-10-10 NOTE — Hospital Course (Addendum)
45 year old man with PMH essential hypertension, alcohol use daily, self diagnosed gout, presented with history of fever, nausea, vomiting, diarrhea, bilateral knee swelling and ankle swelling. --10/16 admitted for fever, polyarticular joint pain, knee effusions and pedal edema, anemia --10/17 still has significant edema, consult rheumatology for diagnostic arthrocentesis, rule out gout, rule out infection, consult ED for evaluation -- 10/18 much improved, able to ambulate, plan discharge home.

## 2021-10-10 NOTE — Assessment & Plan Note (Addendum)
--   No evidence of withdrawal.  Patient willing to consider decreasing intake.  Recommend no more than 1-2 drinks per day, abstinence would be better.

## 2021-10-10 NOTE — Assessment & Plan Note (Addendum)
--  replete, ideally

## 2021-10-10 NOTE — Consult Note (Signed)
Reason for Consult: Joint pain Referring Physician: Hospitalist  Shawn Wolfe   HPI: 45 year old white male.  Production designer, theatre/television/film at [employer].  History of gout.  Requiring hospitalization for significant foot involvement and carry about 2 years ago.  Since then he has had 2-3 episodes. Is 4 feet.  Has previously colchicine and steroids.  Never been on allopurinol Family history of gout in her brother.  Patient is on diuretics.  He drinks alcohol regularly Had nausea and vomiting and diarrhea 3 days ago.  The following day he had swelling in both ankles with tenderness and soreness.  Could hardly walk.  And then and left greater than right knee.  Could not ambulate.  Came to the hospital.  Had fever up to 101.  No rash.  Found to be significantly anemic.  Mildly thrombocytopenic at 1 47,000.  Hemoglobin 9.7 and came down to 7.  Elevated uric acid at 8.3.  Low B12.  Low iron.  Liver functions abnormal.  Ultrasound unremarkable.  He has had some analgesic.  Was just started on colchicine.  Ankles are better.  Left knee is still very swollen and tender No significant rash.  No bloody diarrhea by his report.  No history of Crohn's disease.   PMH: History of hypertension.  On diuretic.  No history of kidney stones  SURGICAL HISTORY: No joint surgery.  Family History; brother has gout  Social History: Daily alcohol  Allergies:  Allergies  Allergen Reactions   Sulfa Antibiotics Hives    Medications: Scheduled:  acetaminophen  1,000 mg Oral TID   colchicine  0.6 mg Oral BID   cyanocobalamin  1,000 mcg Intramuscular Q7 days   enoxaparin (LOVENOX) injection  40 mg Subcutaneous Q24H   folic acid  1 mg Oral Daily   hydrochlorothiazide  12.5 mg Oral Daily   [START ON 10/11/2021] influenza vac split quadrivalent PF  0.5 mL Intramuscular Tomorrow-1000   levofloxacin  750 mg Oral Daily   lisinopril  20 mg Oral Daily   multivitamin with minerals  1 tablet Oral Daily   potassium chloride  20 mEq Oral BID    thiamine  100 mg Oral Daily   Or   thiamine  100 mg Intravenous Daily        ROS: No abdominal pain.  No chest pain.  No recurrent diarrhea or nausea and vomiting   PHYSICAL EXAM: Blood pressure 116/79, pulse 79, temperature 98.4 F (36.9 C), temperature source Oral, resp. rate 20, height 5\' 7"  (1.702 m), weight 81.5 kg, SpO2 100 %. Pleasant male.  Mild pale appearing.  Sclera pale.  Clear chest.  Nontender abdomen.  No pretibial edema.  Ankle edema bilaterally Musculoskeletal: Good range of motion neck and shoulders.  Elbows without tophi.  Wrists MCPs PIPs without synovitis or tophi.  Hips move well.  Bilateral knee effusions.  More prominent on the left.  Bilateral ankle synovitis.  Left first MTP is mildly swollen  Procedure: Left knee prepped in sterile manner.  Aspirated 85 cc inflammatory fluid.  Injected with 2 cc Xylocaine 2 cc Marcaine and cc Kenalog Fluid sent for microscopy and culture.  Discussion with the labs shows numerous white cells and positive monosodium urate crystals  Assessment: Polyarticular gout. -Diuretic -Past history -Family history -Daily alcohol Less likely reactive arthritis given positive crystals on arthrocentesis Significant iron deficiency.  Rule out chronic GI bleed or colitis Low B12 Abnormal liver functions.  Suspect alcohol  Recommendations: Aspiration and injection should help the left knee.  Has already been started on colchicine.  May exacerbate his recent gastroenteritis.  Hesitant to use nonsteroidals given significant anemia and possibility of prior GI blood loss Could consider a single bolus of IV steroid followed by p.o. taper  Ideally would be started on 100 mg allopurinol would wait till his transaminitis and mild cytopenia resolve We could follow-up his gouty arthritis in the office as desired  Kandyce Rud 10/10/2021, 4:29 PM

## 2021-10-11 ENCOUNTER — Encounter: Payer: Self-pay | Admitting: Internal Medicine

## 2021-10-11 ENCOUNTER — Inpatient Hospital Stay: Payer: BC Managed Care – PPO

## 2021-10-11 DIAGNOSIS — D509 Iron deficiency anemia, unspecified: Secondary | ICD-10-CM

## 2021-10-11 DIAGNOSIS — M1009 Idiopathic gout, multiple sites: Secondary | ICD-10-CM

## 2021-10-11 DIAGNOSIS — D563 Thalassemia minor: Secondary | ICD-10-CM

## 2021-10-11 LAB — ECHOCARDIOGRAM COMPLETE
AR max vel: 3.03 cm2
AV Area VTI: 3.13 cm2
AV Area mean vel: 2.88 cm2
AV Mean grad: 5 mmHg
AV Peak grad: 8.1 mmHg
Ao pk vel: 1.42 m/s
Area-P 1/2: 4.8 cm2
Height: 67 in
S' Lateral: 2.23 cm
Weight: 2874.8 oz

## 2021-10-11 LAB — CBC
HCT: 24.5 % — ABNORMAL LOW (ref 39.0–52.0)
Hemoglobin: 7.7 g/dL — ABNORMAL LOW (ref 13.0–17.0)
MCH: 22.8 pg — ABNORMAL LOW (ref 26.0–34.0)
MCHC: 31.4 g/dL (ref 30.0–36.0)
MCV: 72.7 fL — ABNORMAL LOW (ref 80.0–100.0)
Platelets: 211 10*3/uL (ref 150–400)
RBC: 3.37 MIL/uL — ABNORMAL LOW (ref 4.22–5.81)
RDW: 17.5 % — ABNORMAL HIGH (ref 11.5–15.5)
WBC: 5.2 10*3/uL (ref 4.0–10.5)
nRBC: 0 % (ref 0.0–0.2)

## 2021-10-11 LAB — COMPREHENSIVE METABOLIC PANEL
ALT: 101 U/L — ABNORMAL HIGH (ref 0–44)
AST: 125 U/L — ABNORMAL HIGH (ref 15–41)
Albumin: 3.1 g/dL — ABNORMAL LOW (ref 3.5–5.0)
Alkaline Phosphatase: 66 U/L (ref 38–126)
Anion gap: 7 (ref 5–15)
BUN: 13 mg/dL (ref 6–20)
CO2: 25 mmol/L (ref 22–32)
Calcium: 9.1 mg/dL (ref 8.9–10.3)
Chloride: 104 mmol/L (ref 98–111)
Creatinine, Ser: 0.9 mg/dL (ref 0.61–1.24)
GFR, Estimated: >60 mL/min (ref >60)
Glucose, Bld: 131 mg/dL — ABNORMAL HIGH (ref 70–99)
Potassium: 4.7 mmol/L (ref 3.5–5.1)
Sodium: 136 mmol/L (ref 135–145)
Total Bilirubin: 1.9 mg/dL — ABNORMAL HIGH (ref 0.3–1.2)
Total Protein: 7.1 g/dL (ref 6.5–8.1)

## 2021-10-11 LAB — ROCKY MTN SPOTTED FVR ABS PNL(IGG+IGM)
RMSF IgG: NEGATIVE
RMSF IgM: 0.36 index (ref 0.00–0.89)

## 2021-10-11 LAB — URIC ACID: Uric Acid, Serum: 6.1 mg/dL (ref 3.7–8.6)

## 2021-10-11 LAB — OCCULT BLOOD X 1 CARD TO LAB, STOOL: Fecal Occult Bld: NEGATIVE

## 2021-10-11 LAB — HAPTOGLOBIN: Haptoglobin: 426 mg/dL — ABNORMAL HIGH (ref 23–355)

## 2021-10-11 MED ORDER — THIAMINE HCL 100 MG PO TABS: 100.0000 mg | ORAL_TABLET | Freq: Every day | ORAL | Status: DC

## 2021-10-11 MED ORDER — METHYLPREDNISOLONE SODIUM SUCC 40 MG IJ SOLR
40.0000 mg | Freq: Once | INTRAMUSCULAR | Status: AC
  Administered 2021-10-11: 40 mg via INTRAVENOUS
  Filled 2021-10-11: qty 1

## 2021-10-11 MED ORDER — CYANOCOBALAMIN 1000 MCG/ML IJ SOLN: INTRAMUSCULAR | 0 refills | Status: DC

## 2021-10-11 MED ORDER — COVID-19 MRNA VAC-TRIS(PFIZER) 30 MCG/0.3ML IM SUSP
0.3000 mL | Freq: Once | INTRAMUSCULAR | Status: AC
  Administered 2021-10-11: 0.3 mL via INTRAMUSCULAR
  Filled 2021-10-11: qty 0.3

## 2021-10-11 MED ORDER — PREDNISONE 10 MG PO TABS: ORAL_TABLET | ORAL | 0 refills | Status: AC

## 2021-10-11 MED ORDER — IRON (FERROUS SULFATE) 325 (65 FE) MG PO TABS: 325.0000 mg | ORAL_TABLET | Freq: Every day | ORAL | Status: DC

## 2021-10-11 MED ORDER — PREDNISONE 20 MG PO TABS: 40.0000 mg | ORAL_TABLET | Freq: Every day | ORAL | Status: DC

## 2021-10-11 MED ORDER — LISINOPRIL 20 MG PO TABS
20.0000 mg | ORAL_TABLET | Freq: Every day | ORAL | 1 refills | Status: DC
Start: 2021-10-12 — End: 2023-04-10

## 2021-10-11 MED ORDER — COLCHICINE 0.6 MG PO TABS: ORAL_TABLET | ORAL | 1 refills | Status: DC

## 2021-10-11 MED ORDER — ADULT MULTIVITAMIN W/MINERALS CH: 1.0000 | ORAL_TABLET | Freq: Every day | ORAL | Status: AC

## 2021-10-11 MED ORDER — DOXYCYCLINE HYCLATE 100 MG PO TABS: 100.0000 mg | ORAL_TABLET | Freq: Two times a day (BID) | ORAL | 0 refills | Status: DC

## 2021-10-11 NOTE — Progress Notes (Signed)
AVS reviewed and pt verbalized understanding of all instructions. NAD noted and this time and no concerns voiced.

## 2021-10-11 NOTE — Discharge Summary (Addendum)
Physician Discharge Summary   Patient name: Shawn Wolfe  Admit date:     10/09/2021  Discharge date: 10/11/2021  Discharge Physician: Brendia Sacks   PCP: Oswaldo Conroy, MD   Recommendations at discharge:  Follow-up confirmed diagnosis of gout with polyarticular flare.  Consider instituting allopurinol when flare is resolved.  Can follow-up with Dr. Gavin Potters rheumatology if desired. Continue to encourage alcohol cessation/abstinence Follow-up elevated LFTs see below Microcytic anemia in patient with thalassemia minor, iron deficiency anemia, referral to outpatient gastroenterology recommended for consideration of colonoscopy and anemia work-up.  Depending on results, could consider hematology referral based on his history of thalassemia minor. B12 deficiency, see below  Discharge Diagnoses Principal Problem:   Polyarticular acute idiopathic gout Active Problems:   Bilateral knee effusions   Bilateral leg edema   Iron deficiency anemia   Elevated LFTs   Alcohol abuse   Thalassemia minor   Benign essential HTN   B12 deficiency  Hospital Course   45 year old man with PMH essential hypertension, alcohol use daily, self diagnosed gout, presented with history of fever, nausea, vomiting, diarrhea, bilateral knee swelling and ankle swelling. --10/16 admitted for fever, polyarticular joint pain, knee effusions and pedal edema, anemia --10/17 still has significant edema, consult rheumatology for diagnostic arthrocentesis, rule out gout, rule out infection, consult ED for evaluation -- 10/18 much improved, able to ambulate, plan discharge home.  * Polyarticular acute idiopathic gout -- Both knees, both ankles.  Appreciate rheumatology evaluation, discussed with Dr. Gavin Potters yesterday.  Arthrocentesis left knee was confirmatory with monosodium urate crystals.  Patient treated with steroid and colchicine, with plans for steroid taper as outpatient, stop colchicine when flare  resolved.  Can use colchicine as needed for flares.  Educated on diet, connection to alcohol and recommendation for abstinence.  Recommend considering instituting allopurinol after flare resolves.  Bilateral knee effusions -- Secondary to gout, much improved with treatment and arthrocentesis left knee  Gout --Presumed diagnosis based on patient's clinical history, never had arthrocentesis, seen in the emergency department twice last year for unilateral knee swelling --Will check uric acid, colchicine, follow-up arthrocentesis -- Dietitian consultation  Thalassemia minor -- Patient remembered he has a diagnosis of thalassemia minor -- Follow-up with PCP  Alcohol abuse -- No evidence of withdrawal.  Patient willing to consider decreasing intake.  Recommend no more than 1-2 drinks per day, abstinence would be better.  Elevated LFTs -- AST and total bilirubin, consistent with history of significant alcohol intake.  Hepatitis panel negative.  Consider repeat CMP in the next week or 2 to document resolution  Iron deficiency anemia -- Stable, no evidence of acute bleeding.  Iron supplementation, outpatient referral to gastroenterology for consideration of colonoscopy.  Bilateral leg edema -- Improving.  Secondary to gout.  Echocardiogram unremarkable.  No evidence of lower extremity DVT.  Expect resolution with treatment of gout.  B12 deficiency --replete, ideally  Benign essential HTN -- Blood pressure stable, continue lisinopril will increase dose since stopping HCTZ (hyperuricemia)   Procedures performed:  Left knee arthrocentesis diagnostic and therapeutic.  Condition at discharge: good  Exam Physical Exam Constitutional:      General: He is not in acute distress.    Appearance: He is not ill-appearing or toxic-appearing.  Cardiovascular:     Rate and Rhythm: Normal rate and regular rhythm.     Heart sounds: No murmur heard. Pulmonary:     Effort: Pulmonary effort is  normal.     Breath sounds: Normal breath sounds. No  wheezing.  Musculoskeletal:     Right lower leg: Edema present.     Left lower leg: Edema present.     Comments: Left knee effusion much decreased, range of movement in the left knee is markedly improved.  Right knee appears unremarkable.  Ankle edema bilaterally and foot edema bilaterally improving.  Neurological:     Mental Status: He is alert.  Psychiatric:        Mood and Affect: Mood normal.        Behavior: Behavior normal.    Disposition: Home  Discharge time: greater than 30 minutes.  Follow-up Information     Oswaldo Conroy, MD. Schedule an appointment as soon as possible for a visit on 10/13/2021.   Specialty: Family Medicine Why: 8:40am appointment Contact information: [address] [place] [address] [phone]                 Allergies as of 10/11/2021       Reactions   Sulfa Antibiotics Hives        Medication List     STOP taking these medications    diclofenac 75 MG EC tablet Commonly known as: VOLTAREN   lisinopril-hydrochlorothiazide 10-12.5 MG tablet Commonly known as: ZESTORETIC   naproxen 500 MG tablet Commonly known as: Naprosyn       TAKE these medications    colchicine 0.6 MG tablet Take one tablet BID until gout is gone. Thereafter, can take as needed for gout flare: take 1.2 mg x1, followed by 0.6mg  x1. What changed: additional instructions   cyanocobalamin 1000 MCG/ML injection Commonly known as: (VITAMIN B-12) One dose q7 days x3 doses, first dose 10/24   Iron (Ferrous Sulfate) 325 (65 Fe) MG Tabs Take 325 mg by mouth daily.   lisinopril 20 MG tablet Commonly known as: ZESTRIL Take 1 tablet (20 mg total) by mouth daily. Start taking on: October 12, 2021   multivitamin with minerals Tabs tablet Take 1 tablet by mouth daily.   predniSONE 10 MG tablet Commonly known as: DELTASONE Take 4 tablets (40 mg total) by mouth daily with  breakfast for 3 days, THEN 2 tablets (20 mg total) daily with breakfast for 3 days, THEN 1 tablet (10 mg total) daily with breakfast for 3 days. Start taking on: October 11, 2021 What changed: See the new instructions.   thiamine 100 MG tablet Take 1 tablet (100 mg total) by mouth daily. Start taking on: October 12, 2021        DG Chest 2 View  Result Date: 10/09/2021 CLINICAL DATA:  Bilateral lower extremity swelling EXAM: CHEST - 2 VIEW COMPARISON:  None. FINDINGS: The cardiomediastinal silhouette is normal. There is no focal consolidation or pulmonary edema. There is no pleural effusion or pneumothorax. There is no acute osseous abnormality. IMPRESSION: No radiographic evidence of acute cardiopulmonary process. Electronically Signed   By: Lesia Hausen M.D.   On: 10/09/2021 15:13   DG Knee 1-2 Views Left  Result Date: 10/09/2021 CLINICAL DATA:  Bilateral lower extremity swelling EXAM: LEFT KNEE - 1-2 VIEW COMPARISON:  None. FINDINGS: Moderate to large joint effusion. No acute bony abnormality. Specifically, no fracture, subluxation, or dislocation. Joint spaces maintained. IMPRESSION: Moderate to large joint effusion.  No acute bony abnormality. Electronically Signed   By: Charlett Nose M.D.   On: 10/09/2021 19:49   DG Knee 1-2 Views Right  Result Date: 10/09/2021 CLINICAL DATA:  Lower extremity swelling EXAM: RIGHT KNEE - 1-2 VIEW COMPARISON:  None. FINDINGS: Small joint effusion. No acute bony abnormality. Specifically, no fracture, subluxation, or dislocation. Joint spaces maintained. Soft tissues are intact. IMPRESSION: Small joint effusion.  No acute bony abnormality. Electronically Signed   By: Charlett Nose M.D.   On: 10/09/2021 19:49   US Abdomen Complete  Result Date: 10/09/2021 CLINICAL DATA:  Fever, liver disease EXAM: ABDOMEN ULTRASOUND COMPLETE COMPARISON:  None. FINDINGS: Gallbladder: No gallstones or wall thickening visualized. No sonographic Murphy sign noted by  sonographer. Common bile duct: Diameter: Normal caliber, 4 mm Liver: No focal lesion identified. Within normal limits in parenchymal echogenicity. Portal vein is patent on color Doppler imaging with normal direction of blood flow towards the liver. IVC: No abnormality visualized. Pancreas: Visualized portion unremarkable. Spleen: Size and appearance within normal limits. Right Kidney: Length: 10.3 cm. Echogenicity within normal limits. No mass or hydronephrosis visualized. Left Kidney: Length: 11.1 cm. Echogenicity within normal limits. No mass or hydronephrosis visualized. Abdominal aorta: No aneurysm visualized. Other findings: None. IMPRESSION: No acute findings. Electronically Signed   By: Charlett Nose M.D.   On: 10/09/2021 18:26   US Venous Img Lower Bilateral (DVT)  Result Date: 10/10/2021 CLINICAL DATA:  Edema for 2 days EXAM: BILATERAL LOWER EXTREMITY VENOUS DOPPLER ULTRASOUND TECHNIQUE: Gray-scale sonography with compression, as well as color and duplex ultrasound, were performed to evaluate the deep venous system(s) from the level of the common femoral vein through the popliteal and proximal calf veins. COMPARISON:  None. FINDINGS: VENOUS Normal compressibility of the common femoral, superficial femoral, and popliteal veins, as well as the visualized calf veins. Visualized portions of profunda femoral vein and great saphenous vein unremarkable. No filling defects to suggest DVT on grayscale or color Doppler imaging. Doppler waveforms show normal direction of venous flow, normal respiratory plasticity and response to augmentation. OTHER None. Limitations: none IMPRESSION: No lower extremity DVT Electronically Signed   By: Acquanetta Belling M.D.   On: 10/10/2021 14:12   ECHOCARDIOGRAM COMPLETE  Result Date: 10/11/2021    ECHOCARDIOGRAM REPORT   Patient Name:   TRAJAN GROVE Date of Exam: 10/10/2021 Medical Rec #:  [id number]         Height:       67.0 in Accession #:    [id number]        Weight:        179.7 lb Date of Birth:  1976/11/06          BSA:          1.932 m Patient Age:    45 years          BP:           110/64 mmHg Patient Gender: M                 HR:           88 bpm. Exam Location:  [facility] Procedure: 2D Echo, Cardiac Doppler and Color Doppler Indications:     Edema  History:         Patient has no prior history of Echocardiogram examinations.  Sonographer:     Cristela Blue Referring Phys:  [id number] Jazon Jipson Diagnosing Phys: Arnoldo Hooker MD  Sonographer Comments: Suboptimal apical window. IMPRESSIONS  1. Left ventricular ejection fraction, by estimation, is 60 to 65%. The left ventricle has normal function. The left ventricle has no regional wall motion abnormalities. Left ventricular diastolic parameters were normal.  2. Right ventricular systolic function is normal. The right ventricular size is normal.  3. The mitral valve is normal in structure. Trivial mitral valve regurgitation.  4. The aortic valve is normal in structure. Aortic valve regurgitation is not visualized. FINDINGS  Left Ventricle: Left ventricular ejection fraction, by estimation, is 60 to 65%. The left ventricle has normal function. The left ventricle has no regional wall motion abnormalities. The left ventricular internal cavity size was small. There is no left ventricular hypertrophy. Left ventricular diastolic parameters were normal. Right Ventricle: The right ventricular size is normal. No increase in right ventricular wall thickness. Right ventricular systolic function is normal. Left Atrium: Left atrial size was normal in size. Right Atrium: Right atrial size was normal in size. Pericardium: There is no evidence of pericardial effusion. Mitral Valve: The mitral valve is normal in structure. Trivial mitral valve regurgitation. Tricuspid Valve: The tricuspid valve is normal in structure. Tricuspid valve regurgitation is trivial. Aortic Valve: The aortic valve is normal in structure. Aortic valve regurgitation is not  visualized. Aortic valve mean gradient measures 5.0 mmHg. Aortic valve peak gradient measures 8.1 mmHg. Aortic valve area, by VTI measures 3.13 cm. Pulmonic Valve: The pulmonic valve was normal in structure. Pulmonic valve regurgitation is not visualized. Aorta: The aortic root and ascending aorta are structurally normal, with no evidence of dilitation. IAS/Shunts: No atrial level shunt detected by color flow Doppler.  LEFT VENTRICLE PLAX 2D LVIDd:         3.31 cm   Diastology LVIDs:         2.23 cm   LV e' medial:    8.49 cm/s LV PW:         1.11 cm   LV E/e' medial:  9.2 LV IVS:        0.94 cm   LV e' lateral:   9.46 cm/s LVOT diam:     2.00 cm   LV E/e' lateral: 8.2 LV SV:         80 LV SV Index:   41 LVOT Area:     3.14 cm  RIGHT VENTRICLE RV S prime:     15.00 cm/s TAPSE (M-mode): 3.6 cm LEFT ATRIUM             Index        RIGHT ATRIUM           Index LA diam:        3.70 cm 1.91 cm/m   RA Area:     11.10 cm LA Vol (A2C):   69.9 ml 36.18 ml/m  RA Volume:   21.30 ml  11.02 ml/m LA Vol (A4C):   36.6 ml 18.94 ml/m LA Biplane Vol: 55.1 ml 28.52 ml/m  AORTIC VALVE                     PULMONIC VALVE AV Area (Vmax):    3.03 cm      PV Vmax:        0.60 m/s AV Area (Vmean):   2.88 cm      PV Peak grad:   1.4 mmHg AV Area (VTI):     3.13 cm      RVOT Peak grad: 3 mmHg AV Vmax:           142.00 cm/s AV Vmean:          103.000 cm/s AV VTI:            0.255 m AV Peak Grad:      8.1 mmHg AV Mean Grad:  5.0 mmHg LVOT Vmax:         137.00 cm/s LVOT Vmean:        94.400 cm/s LVOT VTI:          0.254 m LVOT/AV VTI ratio: 1.00  AORTA Ao Root diam: 3.93 cm MITRAL VALVE               TRICUSPID VALVE MV Area (PHT): 4.80 cm    TR Peak grad:   17.6 mmHg MV Decel Time: 158 msec    TR Vmax:        210.00 cm/s MV E velocity: 78.00 cm/s MV A velocity: 95.10 cm/s  SHUNTS MV E/A ratio:  0.82        Systemic VTI:  0.25 m                            Systemic Diam: 2.00 cm Arnoldo Hooker MD Electronically signed by Arnoldo Hooker MD Signature Date/Time: 10/11/2021/12:13:47 PM    Final    Results for orders placed or performed during the hospital encounter of 10/09/21  Culture, blood (Routine x 2)     Status: None (Preliminary result)   Collection Time: 10/09/21  2:28 PM   Specimen: BLOOD  Result Value Ref Range Status   Specimen Description BLOOD RIGHT ANTECUBITAL  Final   Special Requests   Final    BOTTLES DRAWN AEROBIC AND ANAEROBIC Blood Culture results may not be optimal due to an inadequate volume of blood received in culture bottles   Culture   Final    NO GROWTH 2 DAYS Performed at [facility], [address], [place], [place] [address]    Report Status PENDING  Incomplete  Culture, blood (Routine x 2)     Status: None (Preliminary result)   Collection Time: 10/09/21  5:03 PM   Specimen: BLOOD  Result Value Ref Range Status   Specimen Description BLOOD LEFT ANTECUBITAL  Final   Special Requests   Final    BOTTLES DRAWN AEROBIC AND ANAEROBIC Blood Culture adequate volume   Culture   Final    NO GROWTH 2 DAYS Performed at [facility], [address] [place], [place] [address]    Report Status PENDING  Incomplete  Resp Panel by RT-PCR (Flu A&B, Covid) Nasopharyngeal Swab     Status: None   Collection Time: 10/09/21  5:03 PM   Specimen: Nasopharyngeal Swab; Nasopharyngeal(NP) swabs in vial transport medium  Result Value Ref Range Status   SARS Coronavirus 2 by RT PCR NEGATIVE NEGATIVE Final    Comment: (NOTE) SARS-CoV-2 target nucleic acids are NOT DETECTED.  The SARS-CoV-2 RNA is generally detectable in upper respiratory specimens during the acute phase of infection. The lowest concentration of SARS-CoV-2 viral copies this assay can detect is 138 copies/mL. A negative result does not preclude SARS-Cov-2 infection and should not be used as the sole basis for treatment or other patient management decisions. A negative result may occur with  improper specimen  collection/handling, submission of specimen other than nasopharyngeal swab, presence of viral mutation(s) within the areas targeted by this assay, and inadequate number of viral copies(<138 copies/mL). A negative result must be combined with clinical observations, patient history, and epidemiological information. The expected result is Negative.  Fact Sheet for Patients:  BloggerCourse.com  Fact Sheet for Healthcare Providers:  SeriousBroker.it  This test is no t yet approved or cleared by the [place] and  has been authorized for detection and/or diagnosis of SARS-CoV-2 by FDA under an Emergency Use Authorization (EUA). This EUA will remain  in effect (meaning this test can be used) for the duration of the COVID-19 declaration under Section 564(b)(1) of the Act, 21 U.S.C.section 360bbb-3(b)(1), unless the authorization is terminated  or revoked sooner.       Influenza A by PCR NEGATIVE NEGATIVE Final   Influenza B by PCR NEGATIVE NEGATIVE Final    Comment: (NOTE) The Xpert Xpress SARS-CoV-2/FLU/RSV plus assay is intended as an aid in the diagnosis of influenza from Nasopharyngeal swab specimens and should not be used as a sole basis for treatment. Nasal washings and aspirates are unacceptable for Xpert Xpress SARS-CoV-2/FLU/RSV testing.  Fact Sheet for Patients: BloggerCourse.com  Fact Sheet for Healthcare Providers: SeriousBroker.it  This test is not yet approved or cleared by the [place] FDA and has been authorized for detection and/or diagnosis of SARS-CoV-2 by FDA under an Emergency Use Authorization (EUA). This EUA will remain in effect (meaning this test can be used) for the duration of the COVID-19 declaration under Section 564(b)(1) of the Act, 21 U.S.C. section 360bbb-3(b)(1), unless the authorization is terminated or revoked.  Performed at [facility], [address] [place], [place] [address]   Chlamydia/NGC rt PCR [facility] only)     Status: None   Collection Time: 10/09/21  5:03 PM   Specimen: Urine; GU  Result Value Ref Range Status   Specimen source GC/Chlam URINE, RANDOM  Final   Chlamydia Tr NOT DETECTED NOT DETECTED Final   N gonorrhoeae NOT DETECTED NOT DETECTED Final    Comment: (NOTE) This CT/NG assay has not been evaluated in patients with a history of  hysterectomy. Performed at [facility], [address] [place], [place] [address]   Body fluid culture w Gram Stain     Status: None (Preliminary result)   Collection Time: 10/10/21  1:24 PM   Specimen: Synovium; Body Fluid  Result Value Ref Range Status   Specimen Description   Final    SYNOVIAL Performed at [facility], [address] [place], [place] [address]    Special Requests   Final    Normal Performed at [facility], [address] [place], [place] [address]    Gram Stain   Final    ABUNDANT WBC PRESENT, PREDOMINANTLY PMN NO ORGANISMS SEEN    Culture   Final    NO GROWTH < 24 HOURS Performed at [facility], [address], [place], [place] [address]    Report Status PENDING  Incomplete    Signed:  Brendia Sacks MD.  [facility] 10/11/2021, 3:50 PM

## 2021-10-11 NOTE — Assessment & Plan Note (Signed)
--   Patient remembered he has a diagnosis of thalassemia minor -- Follow-up with PCP

## 2021-10-11 NOTE — Progress Notes (Signed)
[name] INFECTIOUS DISE[facility] PROGRESS NOTE Date of Admission:  10/09/2021     ID: Shawn Wolfe is a 45 y.o. male with  fever, joint swellingn Principal Problem:   Polyarticular joint involvement Active Problems:   Gout   Benign essential HTN   Fever   Bilateral leg edema   Anemia   Elevated LFTs   Bilateral knee effusions   Alcohol abuse   B12 deficiency   Subjective: NO fever, feels much better  ROS  Eleven systems are reviewed and negative except per hpi  Medications:  Antibiotics Given (last 72 hours)     Date/Time Action Medication Dose   10/09/21 2313 Given   levofloxacin (LEVAQUIN) tablet 750 mg 750 mg   10/10/21 1007 Given   levofloxacin (LEVAQUIN) tablet 750 mg 750 mg   10/10/21 2128 Given   doxycycline (VIBRA-TABS) tablet 100 mg 100 mg   10/11/21 6659 Given   doxycycline (VIBRA-TABS) tablet 100 mg 100 mg       acetaminophen  1,000 mg Oral TID   colchicine  0.6 mg Oral BID   COVID-19 mRNA Vac-TriS (Pf[employer])  0.3 mL Intramuscular Once   cyanocobalamin  1,000 mcg Intramuscular Q7 days   doxycycline  100 mg Oral Q12H   enoxaparin (LOVENOX) injection  40 mg Subcutaneous D35T   folic acid  1 mg Oral Daily   lisinopril  20 mg Oral Daily   multivitamin with minerals  1 tablet Oral Daily   [START ON 10/12/2021] predniSONE  40 mg Oral Q breakfast   thiamine  100 mg Oral Daily   Or   thiamine  100 mg Intravenous Daily    Objective: Vital signs in last 24 hours: Temp:  [98.1 F (36.7 C)-98.4 F (36.9 C)] 98.1 F (36.7 C) (10/18 0758) Pulse Rate:  [71-80] 71 (10/18 0758) Resp:  [16-20] 20 (10/18 0758) BP: (115-146)/(79-92) 115/81 (10/18 0758) SpO2:  [98 %-100 %] 99 % (10/18 0758) Physical Exam  Constitutional: He is oriented to person, place, and time. He appears well-developed and well-nourished. No distress.  HENT:  Mouth/Throat: Oropharynx is clear and moist. No oropharyngeal exudate.  Cardiovascular: Normal rate, regular rhythm and normal  heart sounds. Exam reveals no gallop and no friction rub.  No murmur heard.  Pulmonary/Chest: Effort normal and breath sounds normal. No respiratory distress. He has no wheezes.  Abdominal: Soft. Bowel sounds are normal. He exhibits no distension. There is no tenderness.  Lymphadenopathy:  He has no cervical adenopathy.  Neurological: He is alert and oriented to person, place, and time.  Skin: Skin is warm and dry. No rash noted. No erythema.  Psychiatric: He has a normal mood and affect. His behavior is normal.     Lab Results Recent Labs    10/10/21 0451 10/10/21 1158 10/11/21 0622  WBC 4.7 3.8* 5.2  HGB 7.5* 7.5* 7.7*  HCT 23.5* 22.3* 24.5*  NA 133*  --  136  K 3.2*  --  4.7  CL 97*  --  104  CO2 30  --  25  BUN 9  --  13  CREATININE 0.93  --  0.90    Microbiology: _0 @ Studies/Results: DG Chest 2 View  Result Date: 10/09/2021 CLINICAL DATA:  Bilateral lower extremity swelling EXAM: CHEST - 2 VIEW COMPARISON:  None. FINDINGS: The cardiomediastinal silhouette is normal. There is no focal consolidation or pulmonary edema. There is no pleural effusion or pneumothorax. There is no acute osseous abnormality. IMPRESSION: No radiographic evidence of acute cardiopulmonary process.  Electronically Signed   By: Valetta Mole M.D.   On: 10/09/2021 15:13   DG Knee 1-2 Views Left  Result Date: 10/09/2021 CLINICAL DATA:  Bilateral lower extremity swelling EXAM: LEFT KNEE - 1-2 VIEW COMPARISON:  None. FINDINGS: Moderate to large joint effusion. No acute bony abnormality. Specifically, no fracture, subluxation, or dislocation. Joint spaces maintained. IMPRESSION: Moderate to large joint effusion.  No acute bony abnormality. Electronically Signed   By: Rolm Baptise M.D.   On: 10/09/2021 19:49   DG Knee 1-2 Views Right  Result Date: 10/09/2021 CLINICAL DATA:  Lower extremity swelling EXAM: RIGHT KNEE - 1-2 VIEW COMPARISON:  None. FINDINGS: Small joint effusion. No acute bony  abnormality. Specifically, no fracture, subluxation, or dislocation. Joint spaces maintained. Soft tissues are intact. IMPRESSION: Small joint effusion.  No acute bony abnormality. Electronically Signed   By: Rolm Baptise M.D.   On: 10/09/2021 19:49   US Abdomen Complete  Result Date: 10/09/2021 CLINICAL DATA:  Fever, liver disease EXAM: ABDOMEN ULTRASOUND COMPLETE COMPARISON:  None. FINDINGS: Gallbladder: No gallstones or wall thickening visualized. No sonographic Murphy sign noted by sonographer. Common bile duct: Diameter: Normal caliber, 4 mm Liver: No focal lesion identified. Within normal limits in parenchymal echogenicity. Portal vein is patent on color Doppler imaging with normal direction of blood flow towards the liver. IVC: No abnormality visualized. Pancreas: Visualized portion unremarkable. Spleen: Size and appearance within normal limits. Right Kidney: Length: 10.3 cm. Echogenicity within normal limits. No mass or hydronephrosis visualized. Left Kidney: Length: 11.1 cm. Echogenicity within normal limits. No mass or hydronephrosis visualized. Abdominal aorta: No aneurysm visualized. Other findings: None. IMPRESSION: No acute findings. Electronically Signed   By: Rolm Baptise M.D.   On: 10/09/2021 18:26   US Venous Img Lower Bilateral (DVT)  Result Date: 10/10/2021 CLINICAL DATA:  Edema for 2 days EXAM: BILATERAL LOWER EXTREMITY VENOUS DOPPLER ULTRASOUND TECHNIQUE: Gray-scale sonography with compression, as well as color and duplex ultrasound, were performed to evaluate the deep venous system(s) from the level of the common femoral vein through the popliteal and proximal calf veins. COMPARISON:  None. FINDINGS: VENOUS Normal compressibility of the common femoral, superficial femoral, and popliteal veins, as well as the visualized calf veins. Visualized portions of profunda femoral vein and great saphenous vein unremarkable. No filling defects to suggest DVT on grayscale or color Doppler  imaging. Doppler waveforms show normal direction of venous flow, normal respiratory plasticity and response to augmentation. OTHER None. Limitations: none IMPRESSION: No lower extremity DVT Electronically Signed   By: Miachel Roux M.D.   On: 10/10/2021 14:12   ECHOCARDIOGRAM COMPLETE  Result Date: 10/11/2021    ECHOCARDIOGRAM REPORT   Patient Name:   Shawn Wolfe Date of Exam: 10/10/2021 Medical Rec #:  [id number]         Height:       67.0 in Accession #:    [id number]        Weight:       179.7 lb Date of Birth:  [birth year]          BSA:          1.932 m Patient Age:    36 years          BP:           110/64 mmHg Patient Gender: M                 HR:           88 bpm.  Exam Location:  [facility] Procedure: 2D Echo, Cardiac Doppler and Color Doppler Indications:     Edema  History:         Patient has no prior history of Echocardiogram examinations.  Sonographer:     Sherrie Sport Referring Phys:  Wyanet Atlantic Diagnosing Phys: Serafina Royals MD  Sonographer Comments: Suboptimal apical window. IMPRESSIONS  1. Left ventricular ejection fraction, by estimation, is 60 to 65%. The left ventricle has normal function. The left ventricle has no regional wall motion abnormalities. Left ventricular diastolic parameters were normal.  2. Right ventricular systolic function is normal. The right ventricular size is normal.  3. The mitral valve is normal in structure. Trivial mitral valve regurgitation.  4. The aortic valve is normal in structure. Aortic valve regurgitation is not visualized. FINDINGS  Left Ventricle: Left ventricular ejection fraction, by estimation, is 60 to 65%. The left ventricle has normal function. The left ventricle has no regional wall motion abnormalities. The left ventricular internal cavity size was small. There is no left ventricular hypertrophy. Left ventricular diastolic parameters were normal. Right Ventricle: The right ventricular size is normal. No increase in right ventricular wall  thickness. Right ventricular systolic function is normal. Left Atrium: Left atrial size was normal in size. Right Atrium: Right atrial size was normal in size. Pericardium: There is no evidence of pericardial effusion. Mitral Valve: The mitral valve is normal in structure. Trivial mitral valve regurgitation. Tricuspid Valve: The tricuspid valve is normal in structure. Tricuspid valve regurgitation is trivial. Aortic Valve: The aortic valve is normal in structure. Aortic valve regurgitation is not visualized. Aortic valve mean gradient measures 5.0 mmHg. Aortic valve peak gradient measures 8.1 mmHg. Aortic valve area, by VTI measures 3.13 cm. Pulmonic Valve: The pulmonic valve was normal in structure. Pulmonic valve regurgitation is not visualized. Aorta: The aortic root and ascending aorta are structurally normal, with no evidence of dilitation. IAS/Shunts: No atrial level shunt detected by color flow Doppler.  LEFT VENTRICLE PLAX 2D LVIDd:         3.31 cm   Diastology LVIDs:         2.23 cm   LV e' medial:    8.49 cm/s LV PW:         1.11 cm   LV E/e' medial:  9.2 LV IVS:        0.94 cm   LV e' lateral:   9.46 cm/s LVOT diam:     2.00 cm   LV E/e' lateral: 8.2 LV SV:         80 LV SV Index:   41 LVOT Area:     3.14 cm  RIGHT VENTRICLE RV S prime:     15.00 cm/s TAPSE (M-mode): 3.6 cm LEFT ATRIUM             Index        RIGHT ATRIUM           Index LA diam:        3.70 cm 1.91 cm/m   RA Area:     11.10 cm LA Vol (A2C):   69.9 ml 36.18 ml/m  RA Volume:   21.30 ml  11.02 ml/m LA Vol (A4C):   36.6 ml 18.94 ml/m LA Biplane Vol: 55.1 ml 28.52 ml/m  AORTIC VALVE                     PULMONIC VALVE AV Area (Vmax):    3.03 cm  PV Vmax:        0.60 m/s AV Area (Vmean):   2.88 cm      PV Peak grad:   1.4 mmHg AV Area (VTI):     3.13 cm      RVOT Peak grad: 3 mmHg AV Vmax:           142.00 cm/s AV Vmean:          103.000 cm/s AV VTI:            0.255 m AV Peak Grad:      8.1 mmHg AV Mean Grad:      5.0 mmHg LVOT  Vmax:         137.00 cm/s LVOT Vmean:        94.400 cm/s LVOT VTI:          0.254 m LVOT/AV VTI ratio: 1.00  AORTA Ao Root diam: 3.93 cm MITRAL VALVE               TRICUSPID VALVE MV Area (PHT): 4.80 cm    TR Peak grad:   17.6 mmHg MV Decel Time: 158 msec    TR Vmax:        210.00 cm/s MV E velocity: 78.00 cm/s MV A velocity: 95.10 cm/s  SHUNTS MV E/A ratio:  0.82        Systemic VTI:  0.25 m                            Systemic Diam: 2.00 cm Serafina Royals MD Electronically signed by Serafina Royals MD Signature Date/Time: 10/11/2021/12:13:47 PM    Final     Assessment/Plan: Shawn Wolfe is a 45 y.o. male with a hx of HTN and prior presumed gout, recurrent joint pain over last year now with an episode of NV and diarrhea - short lived- followed by febrile illness, with  LE joint swelling ans pain in bil knees and ankles.   He has a nml wbc but mild lymhopenia, has marked anemia and mild TCP.  Mild elevation in his AST with more marked elevation in his bilirubin.  Hepatitis and HIV testing is negative.  Urinalysis is normal.  His ferritin is elevated at 392.  B12 is low in the 80s.  He does seem to have iron deficiency as well on iron panel.  Ultrasound his abdomen and chest x-ray are normal.  Echocardiogram is pending.  ESR 54 and CRP 24.5 are elevated.  Joint aspiration with 61K wbc 96% PMN and gouty crystals   Perplexing case. Gout can certainly cause the swelling and fevers but no obvious cause of his mod- severe anemia.   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 We have no prior labs on him although he says has had some done at [facility]. His diarrhea has resolved. GC urine negative. Fluid cutlures negative Recommendations I suspect all gout as cause of fevers and joint pain swelling. No evidence septic joint or bacteremia. Stop abx Treat gout Work up anemia. 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  I will sign off Thank you very much for the consult.  Shawn Wolfe   10/11/2021,  2:47 PM

## 2021-10-11 NOTE — TOC Initial Note (Signed)
Transition of Care [facility]) - Initial/Assessment Note    Patient Details  Name: Lowery Paullin MRN: [id number] Date of Birth: 24-Apr-1976  Transition [facility] CM/SW Contact:    Chapman Fitch, RN Phone Number: 10/11/2021, 11:57 AM  Clinical Narrative:                  Patient to discharge today PCP [facility]  Patient agreeable for substance abuse resources for alcohol use. Provided for discharge   Expected Discharge Plan: Home/Self Care Barriers to Discharge: Barriers Resolved   Patient Goals and CMS Choice        Expected Discharge Plan and Services Expected Discharge Plan: Home/Self Care       Living arrangements for the past 2 months: Single Family Home Expected Discharge Date: 10/11/21                                    Prior Living Arrangements/Services Living arrangements for the past 2 months: Single Family Home                     Activities of Daily Living Home Assistive Devices/Equipment: Crutches ADL Screening (condition at time of admission) Patient's cognitive ability adequate to safely complete daily activities?: Yes Is the patient deaf or have difficulty hearing?: No Does the patient have difficulty seeing, even when wearing glasses/contacts?: No Does the patient have difficulty concentrating, remembering, or making decisions?: No Patient able to express need for assistance with ADLs?: No Does the patient have difficulty dressing or bathing?: No Independently performs ADLs?: Yes (appropriate for developmental age) Does the patient have difficulty walking or climbing stairs?: Yes Weakness of Legs: Both Weakness of Arms/Hands: None  Permission Sought/Granted                  Emotional Assessment       Orientation: : Oriented to Self, Oriented to Place, Oriented to  Time, Oriented to Situation Alcohol / Substance Use: Alcohol Use Psych Involvement: No (comment)  Admission diagnosis:  Hyperbilirubinemia  [E80.6] Swelling [R60.9] Transaminitis [R74.01] Fever [R50.9] Bilateral lower extremity edema [R60.0] Arthralgia of both knees [M25.561, M25.562] Patient Active Problem List   Diagnosis Date Noted   Elevated LFTs 10/10/2021   Polyarticular joint involvement 10/10/2021   Bilateral knee effusions 10/10/2021   Alcohol abuse 10/10/2021   B12 deficiency 10/10/2021   Gout 10/09/2021   Benign essential HTN 10/09/2021   Fever 10/09/2021   Hypokalemia 10/09/2021   Bilateral leg edema 10/09/2021   Anemia 10/09/2021   Knee pain, bilateral 10/09/2021   Hyperbilirubinemia 10/09/2021   PCP:  Oswaldo Conroy, MD Pharmacy:   [phone] [place] [place] - [address] [place] [address] Phone: [phone] Fax: [phone]     Social Determinants of Health (SDOH) Interventions    Readmission Risk Interventions No flowsheet data found.

## 2021-10-14 LAB — BODY FLUID CULTURE W GRAM STAIN
Culture: NO GROWTH
Special Requests: NORMAL

## 2021-10-14 LAB — CULTURE, BLOOD (ROUTINE X 2)
Culture: NO GROWTH
Culture: NO GROWTH
Special Requests: ADEQUATE

## 2021-10-18 LAB — LYME DISEASE, WESTERN BLOT
IgG P18 Ab.: ABSENT
IgG P23 Ab.: ABSENT
IgG P28 Ab.: ABSENT
IgG P30 Ab.: ABSENT
IgG P39 Ab.: ABSENT
IgG P41 Ab.: ABSENT
IgG P45 Ab.: ABSENT
IgG P58 Ab.: ABSENT
IgG P66 Ab.: ABSENT
IgG P93 Ab.: ABSENT
IgM P23 Ab.: ABSENT
IgM P39 Ab.: ABSENT
IgM P41 Ab.: ABSENT
Lyme IgG Wb: NEGATIVE
Lyme IgM Wb: NEGATIVE

## 2021-11-02 ENCOUNTER — Inpatient Hospital Stay: Payer: BC Managed Care – PPO

## 2021-11-02 ENCOUNTER — Inpatient Hospital Stay: Payer: BC Managed Care – PPO | Admitting: Oncology

## 2022-02-17 IMAGING — US US ABDOMEN COMPLETE
1 series · 14 of 25 positions shown · non-contrast
Comparison: None.

CLINICAL DATA: Fever, liver disease

EXAM:
ABDOMEN ULTRASOUND COMPLETE

[Series 1: us abdomen complete · 150 acquisitions, 14 frames shown]
[im 1/150]
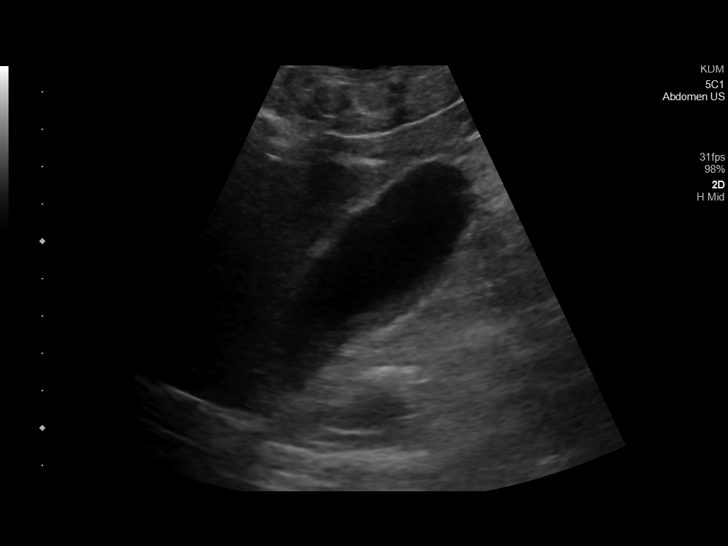
[im 13/150]
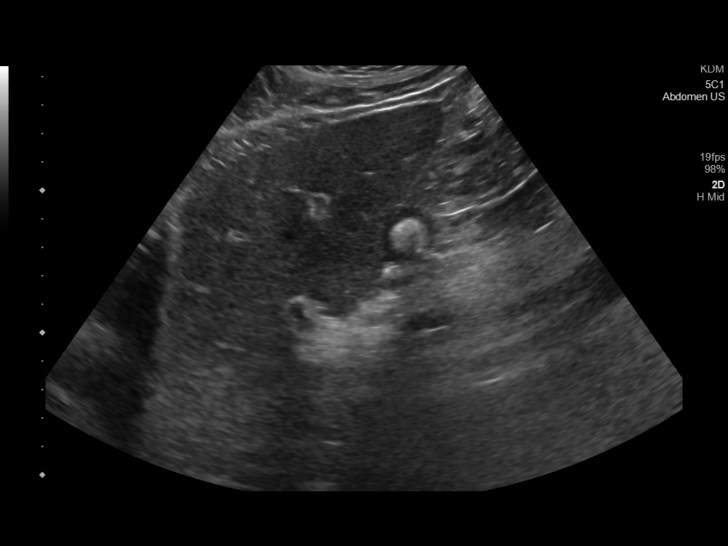
[im 25/150]
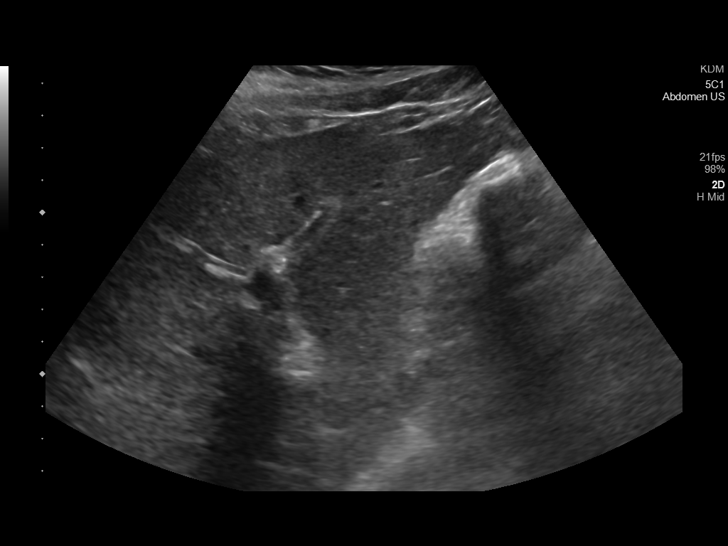
[im 38/150]
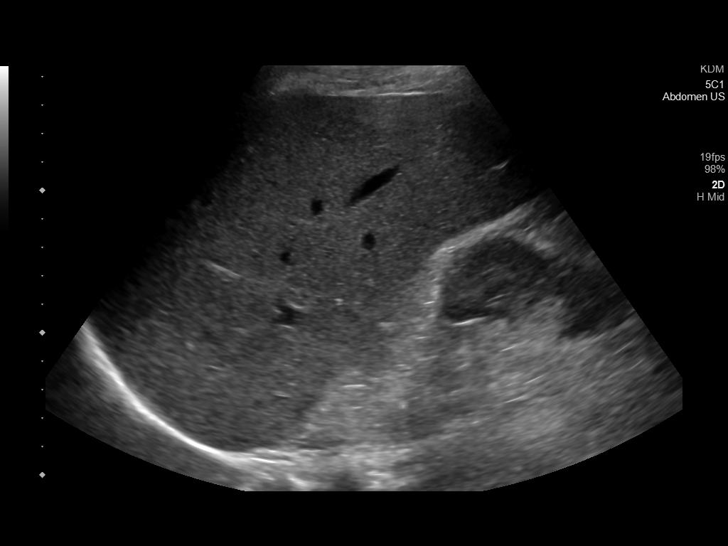
[im 50/150]
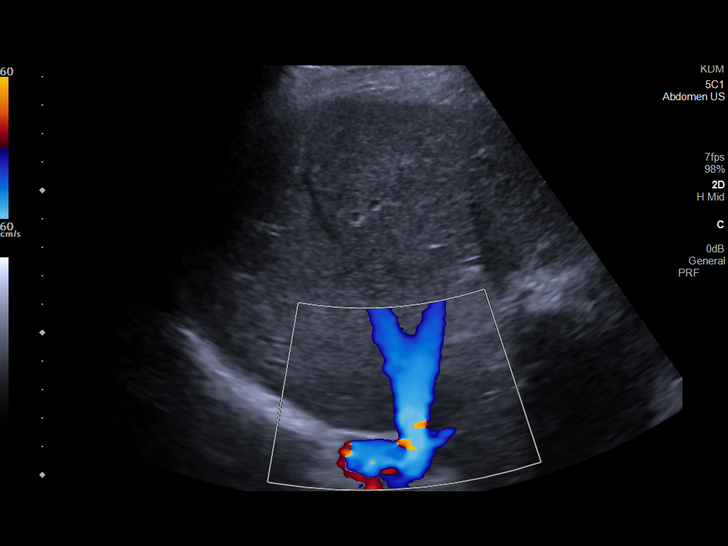
[im 56/150]
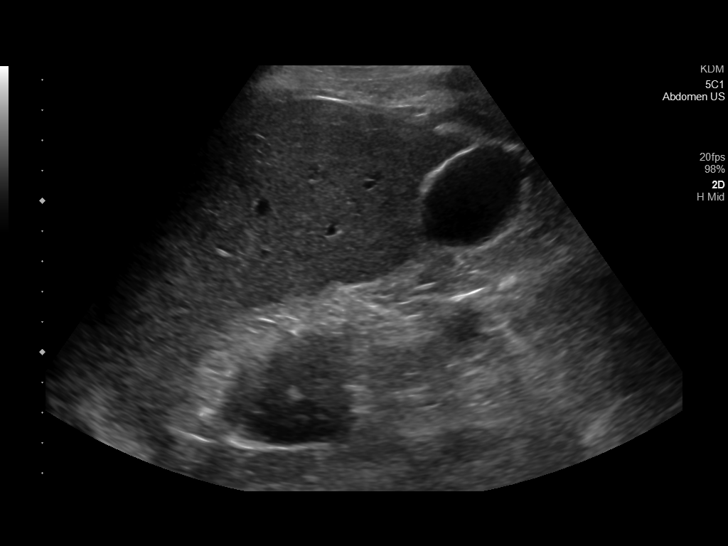
[im 69/150]
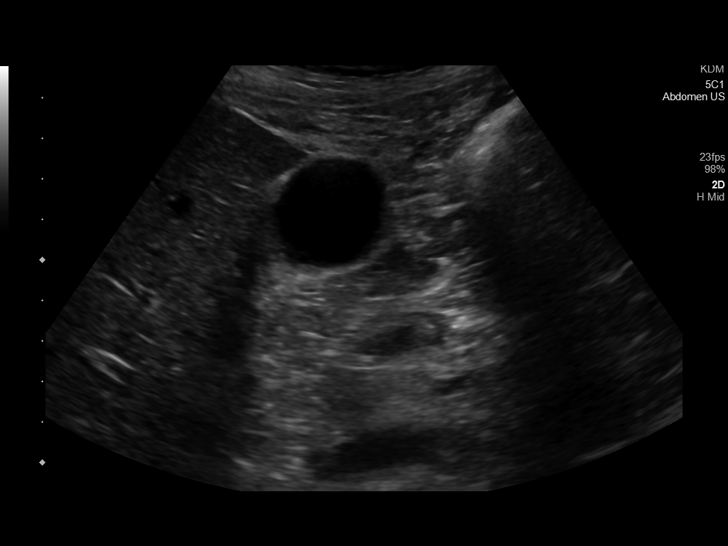
[im 81/150]
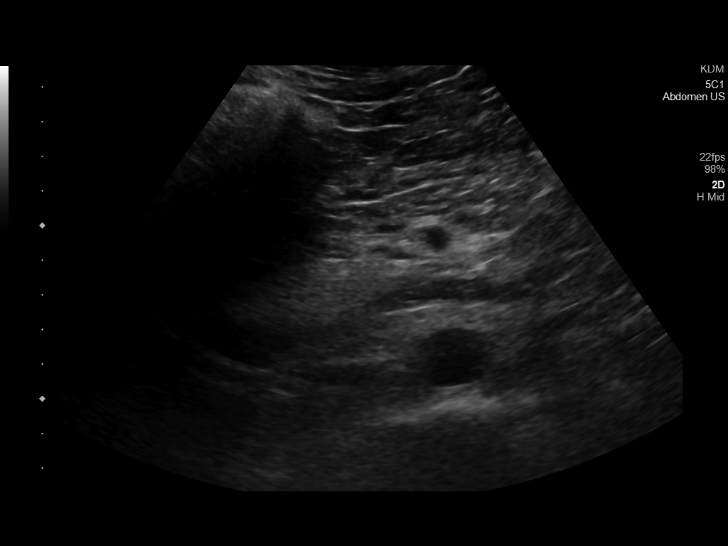
[im 94/150]
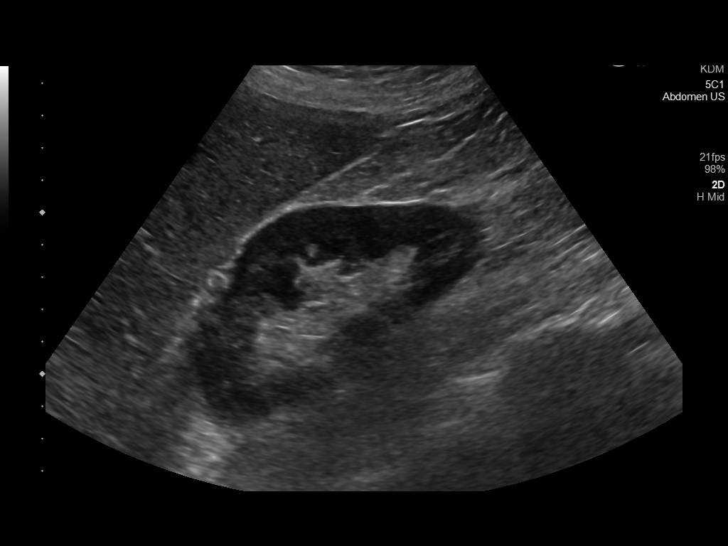
[im 100/150]
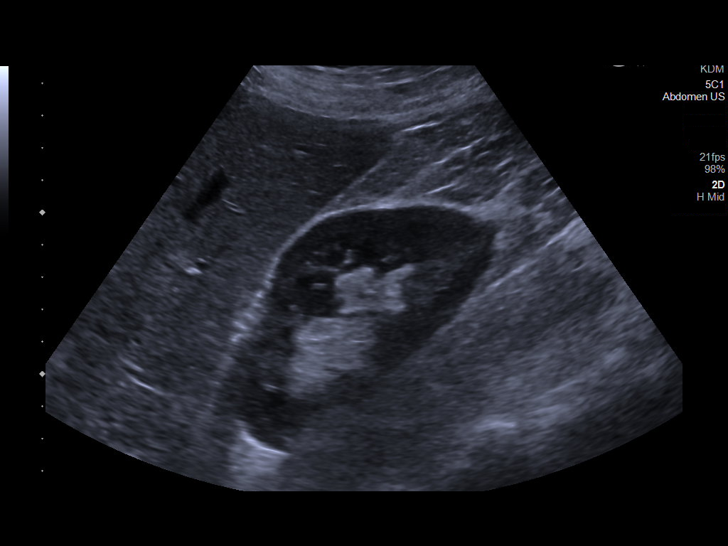
[im 112/150]
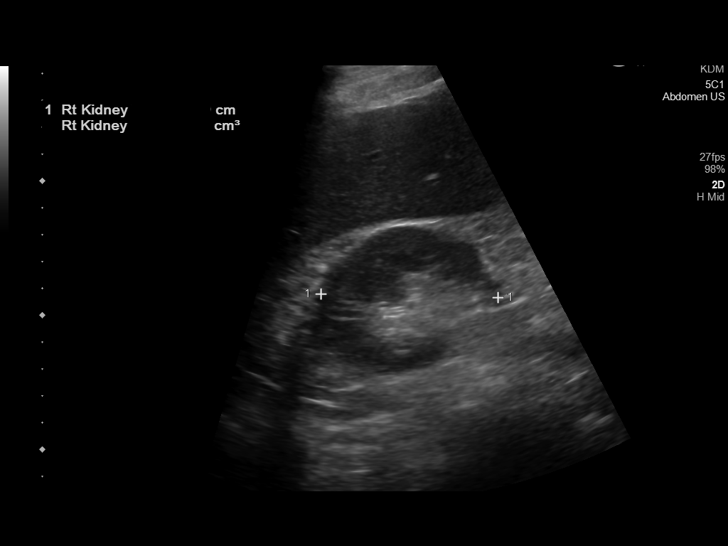
[im 125/150]
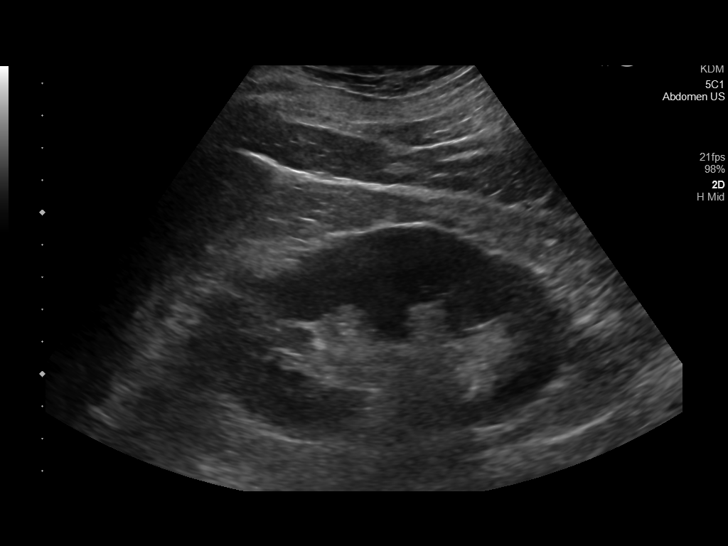
[im 137/150]
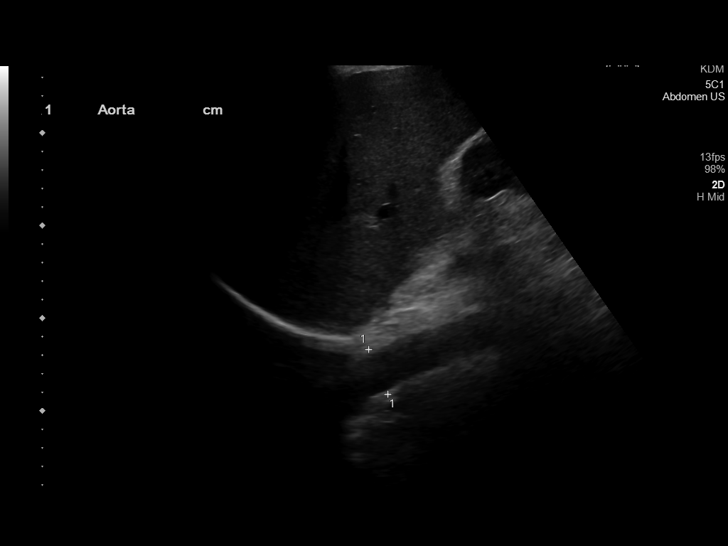
[im 150/150]
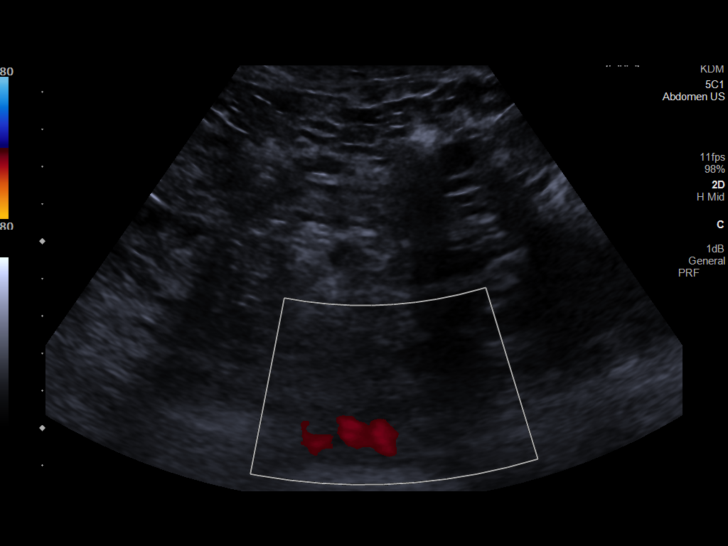

[14 of 25 positions shown; findings below may reference images not displayed]

FINDINGS: Gallbladder: No gallstones or wall thickening visualized. No
sonographic Murphy sign noted by sonographer.

Common bile duct: Diameter: Normal caliber, 4 mm

Liver: No focal lesion identified. Within normal limits in
parenchymal echogenicity. Portal vein is patent on color Doppler
imaging with normal direction of blood flow towards the liver.

IVC: No abnormality visualized.

Pancreas: Visualized portion unremarkable.

Spleen: Size and appearance within normal limits.

Right Kidney: Length: 10.3 cm. Echogenicity within normal limits. No
mass or hydronephrosis visualized.

Left Kidney: Length: 11.1 cm. Echogenicity within normal limits. No
mass or hydronephrosis visualized.

Abdominal aorta: No aneurysm visualized.

Other findings: None.
IMPRESSION: No acute findings.

## 2022-02-18 IMAGING — US US EXTREM LOW VENOUS
1 series · 14 of 24 positions shown · non-contrast
Comparison: None.

CLINICAL DATA: Edema for 2 days

EXAM:
BILATERAL LOWER EXTREMITY VENOUS DOPPLER ULTRASOUND
TECHNIQUE: Gray-scale sonography with compression, as well as color and duplex
ultrasound, were performed to evaluate the deep venous system(s)
from the level of the common femoral vein through the popliteal and
proximal calf veins.

[Series 1: us venous img lower bilat (dvt) · portal-venous · 14 of 58 slices shown]
[im 1/58]
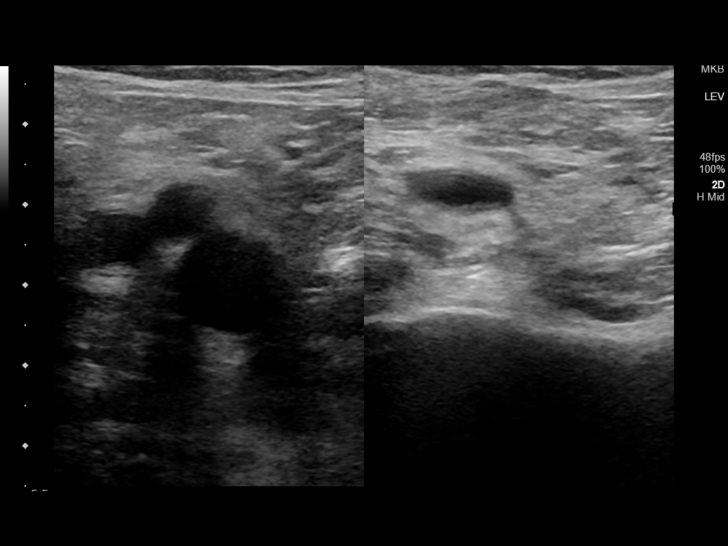
[im 5/58]
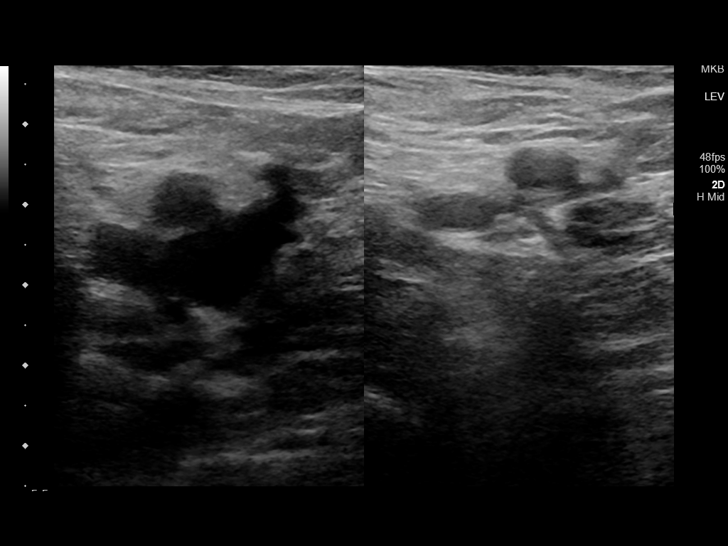
[im 10/58]
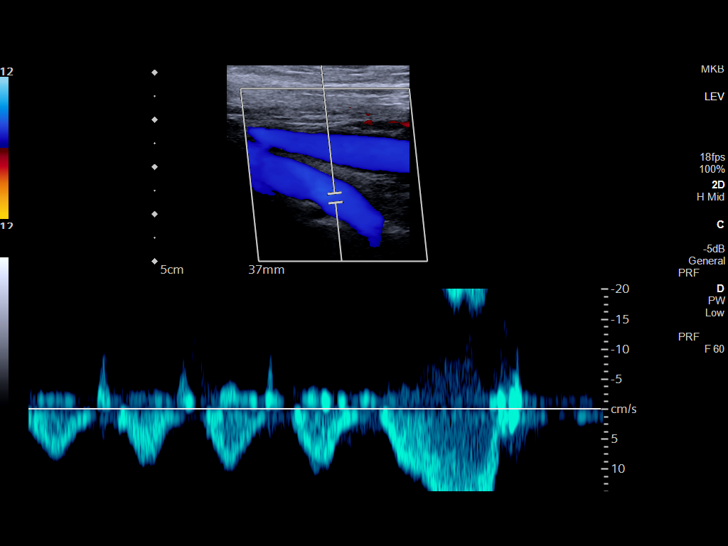
[im 15/58]
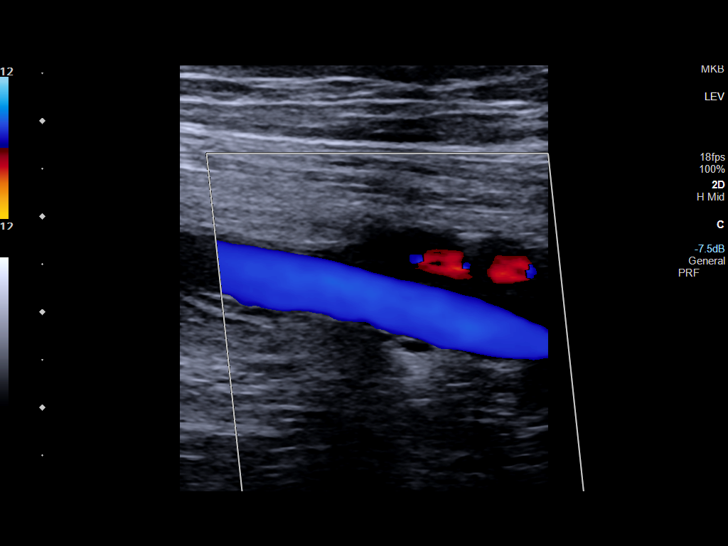
[im 18/58]
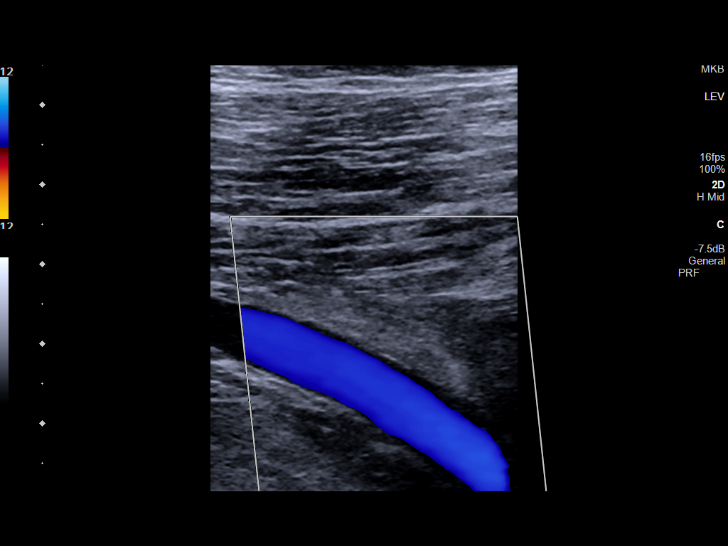
[im 23/58]
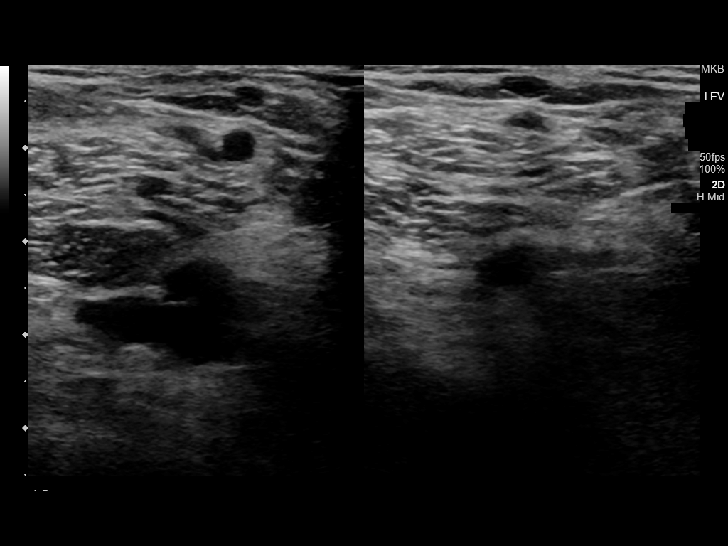
[im 28/58]
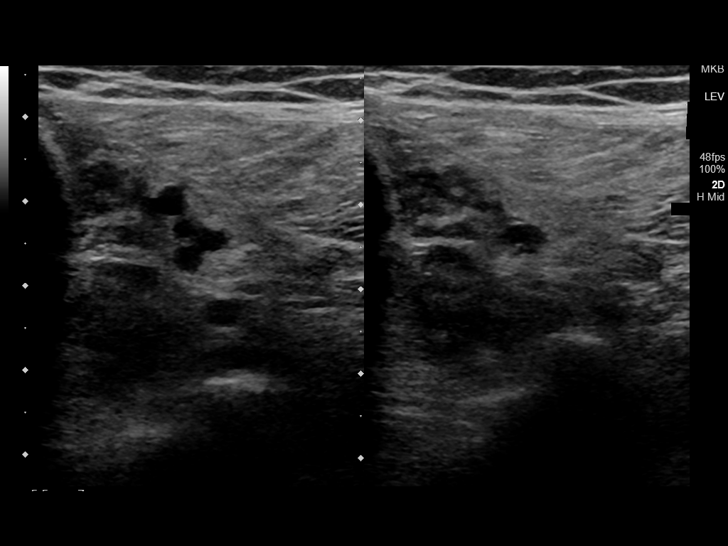
[im 30/58]
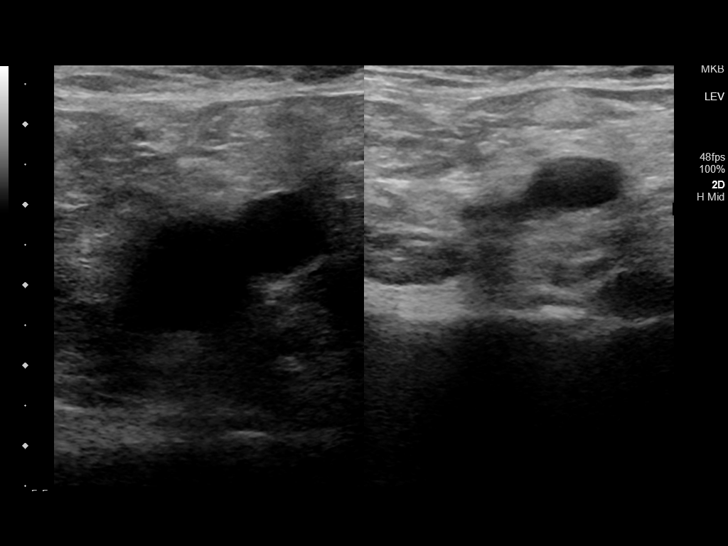
[im 35/58]
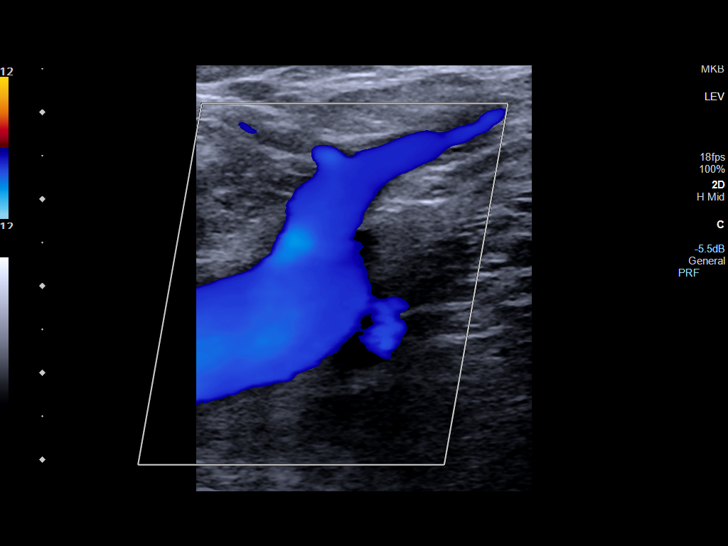
[im 40/58]
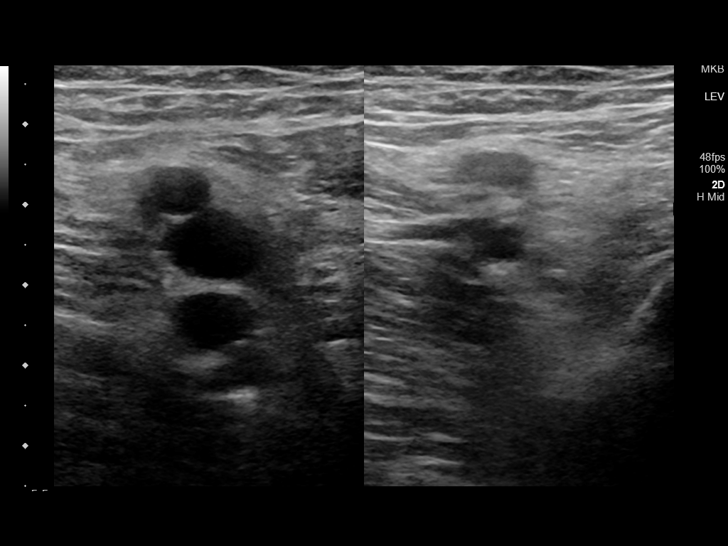
[im 45/58]
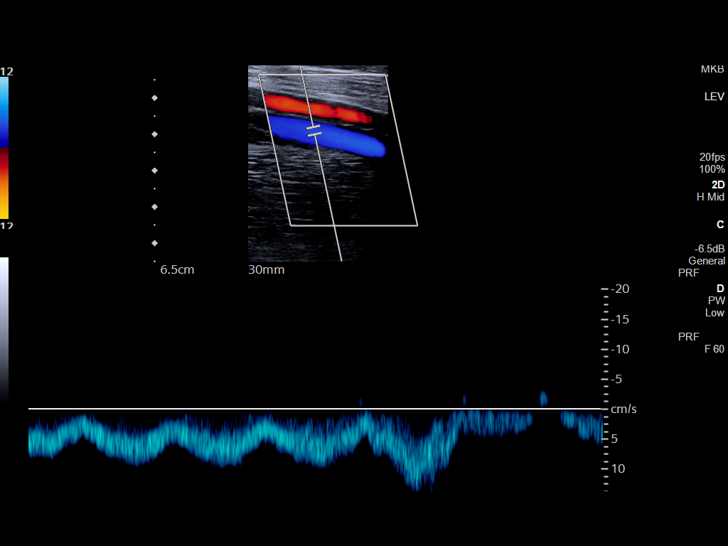
[im 48/58]
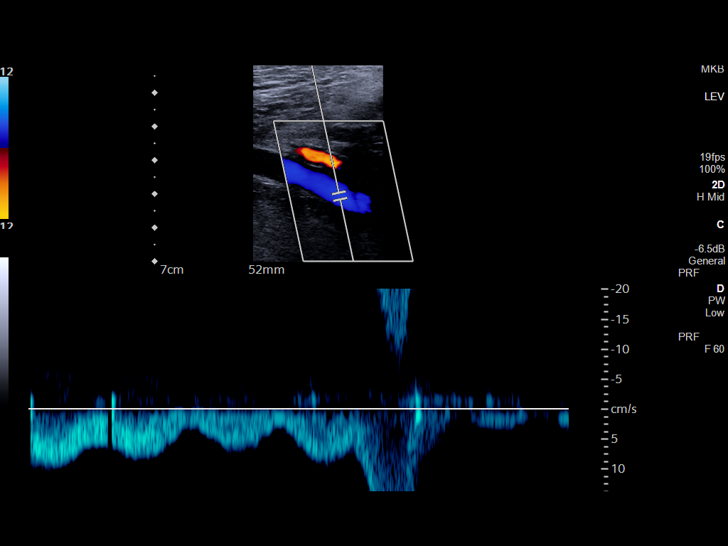
[im 53/58]
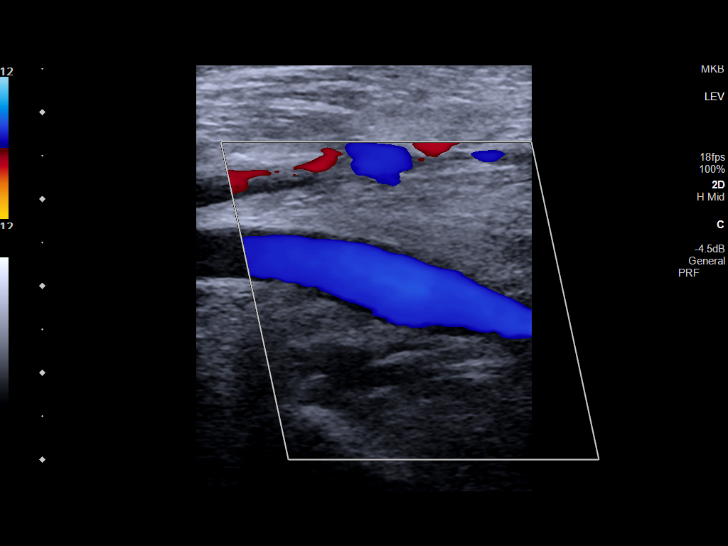
[im 58/58]
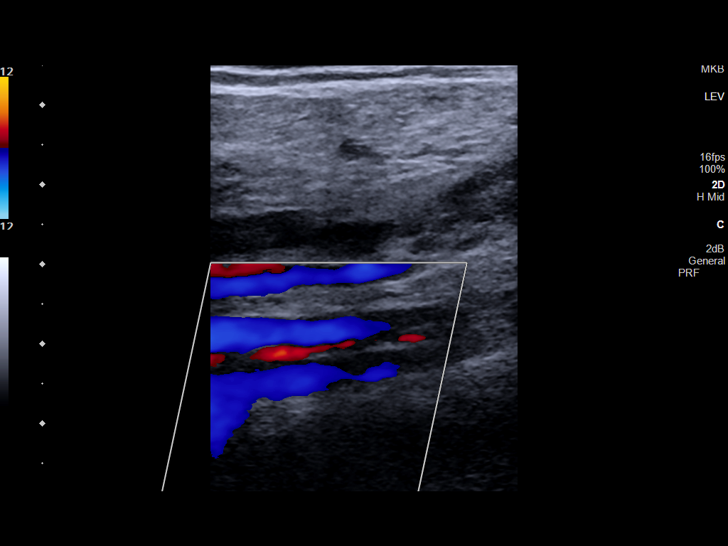

[14 of 24 positions shown; findings below may reference images not displayed]

FINDINGS: VENOUS

Normal compressibility of the common femoral, superficial femoral,
and popliteal veins, as well as the visualized calf veins.
Visualized portions of profunda femoral vein and great saphenous
vein unremarkable. No filling defects to suggest DVT on grayscale or
color Doppler imaging. Doppler waveforms show normal direction of
venous flow, normal respiratory plasticity and response to
augmentation.

OTHER

None.

Limitations: none
IMPRESSION: No lower extremity DVT

## 2023-02-23 DIAGNOSIS — Z419 Encounter for procedure for purposes other than remedying health state, unspecified: Secondary | ICD-10-CM | POA: Diagnosis not present

## 2023-02-26 ENCOUNTER — Emergency Department
Admission: EM | Admit: 2023-02-26 | Discharge: 2023-02-26 | Disposition: A | Discharge status: Home / Self Care | Payer: Medicaid Other | Attending: Emergency Medicine | Admitting: Emergency Medicine

## 2023-02-26 DIAGNOSIS — W540XXA Bitten by dog, initial encounter: Secondary | ICD-10-CM | POA: Insufficient documentation

## 2023-02-26 DIAGNOSIS — S61439A Puncture wound without foreign body of unspecified hand, initial encounter: Secondary | ICD-10-CM | POA: Insufficient documentation

## 2023-02-26 DIAGNOSIS — Z23 Encounter for immunization: Secondary | ICD-10-CM | POA: Insufficient documentation

## 2023-02-26 DIAGNOSIS — S61451A Open bite of right hand, initial encounter: Secondary | ICD-10-CM | POA: Diagnosis not present

## 2023-02-26 DIAGNOSIS — S41111A Laceration without foreign body of right upper arm, initial encounter: Secondary | ICD-10-CM | POA: Insufficient documentation

## 2023-02-26 DIAGNOSIS — S51851A Open bite of right forearm, initial encounter: Secondary | ICD-10-CM | POA: Diagnosis not present

## 2023-02-26 MED ORDER — TETANUS-DIPHTHERIA TOXOIDS TD 5-2 LFU IM INJ
0.5000 mL | INJECTION | Freq: Once | INTRAMUSCULAR | Status: AC
  Administered 2023-02-26: 0.5 mL via INTRAMUSCULAR
  Filled 2023-02-26: qty 0.5

## 2023-02-26 MED ORDER — TRAMADOL HCL 50 MG PO TABS: 50.0000 mg | ORAL_TABLET | Freq: Four times a day (QID) | ORAL | 0 refills | Status: DC | PRN

## 2023-02-26 MED ORDER — AMOXICILLIN-POT CLAVULANATE 875-125 MG PO TABS: 1.0000 | ORAL_TABLET | Freq: Two times a day (BID) | ORAL | 0 refills | Status: AC

## 2023-02-26 MED ORDER — AMOXICILLIN-POT CLAVULANATE 875-125 MG PO TABS
1.0000 | ORAL_TABLET | Freq: Once | ORAL | Status: AC
  Administered 2023-02-26: 1 via ORAL
  Filled 2023-02-26: qty 1

## 2023-02-26 NOTE — ED Triage Notes (Signed)
Pt presents to the ED via POV due to multiple dog bites from home dogs that happened last night. Pt states the dogs were fighting over a bone and he attempted to break up the fight. Pt states he had multiple puncture wounds, lacerations and scratches. Pt also c/o injured finger. Bleeding controlled at the moment in triage. Pt A&Ox4

## 2023-02-26 NOTE — ED Notes (Signed)
Pt has dog bit marks to R upper arm, inner arm, wrist area, and to fingers/top of hand- bleeding controlled, pt's wounds were washed and dressed by RN

## 2023-02-26 NOTE — ED Provider Notes (Signed)
[facility] Provider Note    Event Date/Time   First MD Initiated Contact with Patient 02/26/23 1403     (approximate)   History   Animal Bite   HPI  Tanya Running is a 47 y.o. male who reports that he was bitten by his 2 dogs last night, they were reportedly fighting over a bone and he tried to break it up.  Suffered puncture wounds and lacerations to the right arm.  He reports he had been drinking heavily and slept later than he meant to today.  Unsure of last tetanus     Physical Exam   Triage Vital Signs: ED Triage Vitals  Enc Vitals Group     BP 02/26/23 1309 (!) 159/95     Pulse Rate 02/26/23 1309 (!) 105     Resp 02/26/23 1309 18     Temp 02/26/23 1309 98.5 F (36.9 C)     Temp Source 02/26/23 1309 Oral     SpO2 02/26/23 1309 100 %     Weight 02/26/23 1310 81.5 kg (179 lb 10.8 oz)     Height 02/26/23 1310 1.702 m ('5\' 7"'$ )     Head Circumference --      Peak Flow --      Pain Score 02/26/23 1309 8     Pain Loc --      Pain Edu? --      Excl. in [place]? --     Most recent vital signs: Vitals:   02/26/23 1309  BP: (!) 159/95  Pulse: (!) 105  Resp: 18  Temp: 98.5 F (36.9 C)  SpO2: 100%     General: Awake, no distress.  CV:  Good peripheral perfusion.  Resp:  Normal effort.  Abd:  No distention.  Other:  Right arm: Dorsal right hand approximately 2.5 cm horizontal laceration at the base of the third finger, tendon function is intact  Shallow laceration at the proximal dorsal forearm approximately 2 cm, puncture wounds to the ventral forearm, no bleeding   ED Results / Procedures / Treatments   Labs (all labs ordered are listed, but only abnormal results are displayed) Labs Reviewed - No data to display   EKG    RADIOLOGY     PROCEDURES:  Critical Care performed:   Procedures   MEDICATIONS ORDERED IN ED: Medications  tetanus & diphtheria toxoids (adult) (TENIVAC) injection 0.5 mL (0.5 mLs Intramuscular  Given 02/26/23 1435)  amoxicillin-clavulanate (AUGMENTIN) 875-125 MG per tablet 1 tablet (1 tablet Oral Given 02/26/23 1434)     IMPRESSION / MDM / ASSESSMENT AND PLAN / ED COURSE  I reviewed the triage vital signs and the nursing notes. Patient's presentation is most consistent with acute presentation with potential threat to life or bodily function.  Patient presents after multiple dog bites as above.  This occurred greater than 16 hours prior to presentation.  Tetanus updated in the emergency department.  Will start the patient on Augmentin.  Feel that suturing wounds would be counterproductive at this point, will let them heal by secondary intention  Strict return precautions of any redness swelling discharge etc.        FINAL CLINICAL IMPRESSION(S) / ED DIAGNOSES   Final diagnoses:  Dog bite, initial encounter     Rx / DC Orders   ED Discharge Orders          Ordered    amoxicillin-clavulanate (AUGMENTIN) 875-125 MG tablet  2 times daily  02/26/23 1418    traMADol (ULTRAM) 50 MG tablet  Every 6 hours PRN        02/26/23 1418             Note:  This document was prepared using Dragon voice recognition software and may include unintentional dictation errors.   Lavonia Drafts, MD 02/26/23 [phone]

## 2023-03-26 DIAGNOSIS — Z419 Encounter for procedure for purposes other than remedying health state, unspecified: Secondary | ICD-10-CM | POA: Diagnosis not present

## 2023-04-06 ENCOUNTER — Emergency Department: Payer: Medicaid Other

## 2023-04-06 ENCOUNTER — Other Ambulatory Visit: Payer: Self-pay

## 2023-04-06 ENCOUNTER — Inpatient Hospital Stay
Admission: EM | Admit: 2023-04-06 | Discharge: 2023-04-10 | DRG: 286 | Disposition: A | Discharge status: Home / Self Care | Payer: Medicaid Other | Attending: Family Medicine | Admitting: Family Medicine

## 2023-04-06 DIAGNOSIS — R7401 Elevation of levels of liver transaminase levels: Secondary | ICD-10-CM | POA: Diagnosis not present

## 2023-04-06 DIAGNOSIS — D649 Anemia, unspecified: Secondary | ICD-10-CM | POA: Diagnosis not present

## 2023-04-06 DIAGNOSIS — Z79899 Other long term (current) drug therapy: Secondary | ICD-10-CM | POA: Diagnosis not present

## 2023-04-06 DIAGNOSIS — I5021 Acute systolic (congestive) heart failure: Secondary | ICD-10-CM | POA: Diagnosis not present

## 2023-04-06 DIAGNOSIS — J9601 Acute respiratory failure with hypoxia: Secondary | ICD-10-CM | POA: Diagnosis not present

## 2023-04-06 DIAGNOSIS — Z560 Unemployment, unspecified: Secondary | ICD-10-CM | POA: Diagnosis not present

## 2023-04-06 DIAGNOSIS — E8770 Fluid overload, unspecified: Secondary | ICD-10-CM | POA: Diagnosis not present

## 2023-04-06 DIAGNOSIS — Z91040 Latex allergy status: Secondary | ICD-10-CM

## 2023-04-06 DIAGNOSIS — I428 Other cardiomyopathies: Secondary | ICD-10-CM | POA: Diagnosis present

## 2023-04-06 DIAGNOSIS — D569 Thalassemia, unspecified: Secondary | ICD-10-CM

## 2023-04-06 DIAGNOSIS — R1909 Other intra-abdominal and pelvic swelling, mass and lump: Secondary | ICD-10-CM | POA: Diagnosis not present

## 2023-04-06 DIAGNOSIS — I5031 Acute diastolic (congestive) heart failure: Secondary | ICD-10-CM | POA: Diagnosis present

## 2023-04-06 DIAGNOSIS — K760 Fatty (change of) liver, not elsewhere classified: Secondary | ICD-10-CM | POA: Diagnosis not present

## 2023-04-06 DIAGNOSIS — Z8249 Family history of ischemic heart disease and other diseases of the circulatory system: Secondary | ICD-10-CM | POA: Diagnosis not present

## 2023-04-06 DIAGNOSIS — Y900 Blood alcohol level of less than 20 mg/100 ml: Secondary | ICD-10-CM | POA: Diagnosis present

## 2023-04-06 DIAGNOSIS — F1010 Alcohol abuse, uncomplicated: Secondary | ICD-10-CM | POA: Diagnosis present

## 2023-04-06 DIAGNOSIS — I517 Cardiomegaly: Secondary | ICD-10-CM

## 2023-04-06 DIAGNOSIS — Z91148 Patient's other noncompliance with medication regimen for other reason: Secondary | ICD-10-CM

## 2023-04-06 DIAGNOSIS — Z6829 Body mass index (BMI) 29.0-29.9, adult: Secondary | ICD-10-CM

## 2023-04-06 DIAGNOSIS — Z882 Allergy status to sulfonamides status: Secondary | ICD-10-CM | POA: Diagnosis not present

## 2023-04-06 DIAGNOSIS — M10062 Idiopathic gout, left knee: Secondary | ICD-10-CM | POA: Diagnosis present

## 2023-04-06 DIAGNOSIS — R17 Unspecified jaundice: Secondary | ICD-10-CM

## 2023-04-06 DIAGNOSIS — F17200 Nicotine dependence, unspecified, uncomplicated: Secondary | ICD-10-CM | POA: Diagnosis present

## 2023-04-06 DIAGNOSIS — E538 Deficiency of other specified B group vitamins: Secondary | ICD-10-CM | POA: Diagnosis present

## 2023-04-06 DIAGNOSIS — I110 Hypertensive heart disease with heart failure: Secondary | ICD-10-CM | POA: Diagnosis not present

## 2023-04-06 DIAGNOSIS — I3139 Other pericardial effusion (noninflammatory): Secondary | ICD-10-CM | POA: Diagnosis not present

## 2023-04-06 DIAGNOSIS — D563 Thalassemia minor: Secondary | ICD-10-CM | POA: Diagnosis present

## 2023-04-06 DIAGNOSIS — R1900 Intra-abdominal and pelvic swelling, mass and lump, unspecified site: Secondary | ICD-10-CM | POA: Diagnosis not present

## 2023-04-06 DIAGNOSIS — E876 Hypokalemia: Secondary | ICD-10-CM | POA: Diagnosis not present

## 2023-04-06 DIAGNOSIS — Z789 Other specified health status: Secondary | ICD-10-CM | POA: Diagnosis not present

## 2023-04-06 DIAGNOSIS — J90 Pleural effusion, not elsewhere classified: Secondary | ICD-10-CM | POA: Diagnosis not present

## 2023-04-06 DIAGNOSIS — R0600 Dyspnea, unspecified: Secondary | ICD-10-CM | POA: Diagnosis not present

## 2023-04-06 DIAGNOSIS — M109 Gout, unspecified: Secondary | ICD-10-CM | POA: Diagnosis present

## 2023-04-06 DIAGNOSIS — D509 Iron deficiency anemia, unspecified: Secondary | ICD-10-CM | POA: Diagnosis present

## 2023-04-06 DIAGNOSIS — M10072 Idiopathic gout, left ankle and foot: Secondary | ICD-10-CM | POA: Diagnosis present

## 2023-04-06 DIAGNOSIS — R945 Abnormal results of liver function studies: Secondary | ICD-10-CM | POA: Diagnosis not present

## 2023-04-06 DIAGNOSIS — R601 Generalized edema: Secondary | ICD-10-CM | POA: Diagnosis present

## 2023-04-06 DIAGNOSIS — M10061 Idiopathic gout, right knee: Secondary | ICD-10-CM | POA: Diagnosis present

## 2023-04-06 DIAGNOSIS — E669 Obesity, unspecified: Secondary | ICD-10-CM | POA: Diagnosis present

## 2023-04-06 DIAGNOSIS — K709 Alcoholic liver disease, unspecified: Secondary | ICD-10-CM | POA: Insufficient documentation

## 2023-04-06 DIAGNOSIS — R0902 Hypoxemia: Secondary | ICD-10-CM | POA: Diagnosis not present

## 2023-04-06 DIAGNOSIS — M79661 Pain in right lower leg: Secondary | ICD-10-CM | POA: Diagnosis not present

## 2023-04-06 DIAGNOSIS — M79604 Pain in right leg: Secondary | ICD-10-CM | POA: Diagnosis not present

## 2023-04-06 HISTORY — DX: Alcohol abuse, uncomplicated: F10.10

## 2023-04-06 HISTORY — DX: Personal history of other medical treatment: Z92.89

## 2023-04-06 HISTORY — DX: Other specified abnormal findings of blood chemistry: R79.89

## 2023-04-06 HISTORY — DX: Deficiency of other specified B group vitamins: E53.8

## 2023-04-06 HISTORY — DX: Iron deficiency anemia, unspecified: D50.9

## 2023-04-06 LAB — IRON AND TIBC
Iron: 41 ug/dL — ABNORMAL LOW (ref 45–182)
Saturation Ratios: 15 % — ABNORMAL LOW (ref 17.9–39.5)
TIBC: 269 ug/dL (ref 250–450)
UIBC: 228 ug/dL

## 2023-04-06 LAB — COMPREHENSIVE METABOLIC PANEL
ALT: 30 U/L (ref 0–44)
AST: 57 U/L — ABNORMAL HIGH (ref 15–41)
Albumin: 2.5 g/dL — ABNORMAL LOW (ref 3.5–5.0)
Alkaline Phosphatase: 40 U/L (ref 38–126)
Anion gap: 14 (ref 5–15)
BUN: <5 mg/dL — ABNORMAL LOW (ref 6–20)
CO2: 36 mmol/L — ABNORMAL HIGH (ref 22–32)
Calcium: 6.9 mg/dL — ABNORMAL LOW (ref 8.9–10.3)
Chloride: 87 mmol/L — ABNORMAL LOW (ref 98–111)
Creatinine, Ser: 0.52 mg/dL — ABNORMAL LOW (ref 0.61–1.24)
GFR, Estimated: >60 mL/min (ref >60)
Glucose, Bld: 117 mg/dL — ABNORMAL HIGH (ref 70–99)
Potassium: 2.1 mmol/L — CL (ref 3.5–5.1)
Sodium: 137 mmol/L (ref 135–145)
Total Bilirubin: 5.7 mg/dL — ABNORMAL HIGH (ref 0.3–1.2)
Total Protein: 5.4 g/dL — ABNORMAL LOW (ref 6.5–8.1)

## 2023-04-06 LAB — PREPARE RBC (CROSSMATCH)

## 2023-04-06 LAB — CBC WITH DIFFERENTIAL/PLATELET
Abs Immature Granulocytes: 0.03 10*3/uL (ref 0.00–0.07)
Basophils Absolute: 0 10*3/uL (ref 0.0–0.1)
Basophils Relative: 0 %
Eosinophils Absolute: 0 10*3/uL (ref 0.0–0.5)
Eosinophils Relative: 0 %
HCT: 19.4 % — ABNORMAL LOW (ref 39.0–52.0)
Hemoglobin: 5.8 g/dL — ABNORMAL LOW (ref 13.0–17.0)
Immature Granulocytes: 1 %
Lymphocytes Relative: 11 %
Lymphs Abs: 0.6 10*3/uL — ABNORMAL LOW (ref 0.7–4.0)
MCH: 26.1 pg (ref 26.0–34.0)
MCHC: 29.9 g/dL — ABNORMAL LOW (ref 30.0–36.0)
MCV: 87.4 fL (ref 80.0–100.0)
Monocytes Absolute: 0.4 10*3/uL (ref 0.1–1.0)
Monocytes Relative: 8 %
Neutro Abs: 4.3 10*3/uL (ref 1.7–7.7)
Neutrophils Relative %: 80 %
Platelets: 152 10*3/uL (ref 150–400)
RBC: 2.22 MIL/uL — ABNORMAL LOW (ref 4.22–5.81)
RDW: 24.3 % — ABNORMAL HIGH (ref 11.5–15.5)
Smear Review: NORMAL
WBC: 5.4 10*3/uL (ref 4.0–10.5)
nRBC: 2.6 % — ABNORMAL HIGH (ref 0.0–0.2)

## 2023-04-06 LAB — BPAM RBC: Unit Type and Rh: 5100

## 2023-04-06 LAB — URINALYSIS, W/ REFLEX TO CULTURE (INFECTION SUSPECTED)
Bacteria, UA: NONE SEEN
Bilirubin Urine: NEGATIVE
Glucose, UA: NEGATIVE mg/dL
Hgb urine dipstick: NEGATIVE
Ketones, ur: NEGATIVE mg/dL
Leukocytes,Ua: NEGATIVE
Nitrite: NEGATIVE
Protein, ur: NEGATIVE mg/dL
Specific Gravity, Urine: 1.008 (ref 1.005–1.030)
Squamous Epithelial / HPF: NONE SEEN /HPF (ref 0–5)
pH: 9 — ABNORMAL HIGH (ref 5.0–8.0)

## 2023-04-06 LAB — FERRITIN: Ferritin: 517 ng/mL — ABNORMAL HIGH (ref 24–336)

## 2023-04-06 LAB — RETICULOCYTES
Immature Retic Fract: 37.4 % — ABNORMAL HIGH (ref 2.3–15.9)
RBC.: 2.2 MIL/uL — ABNORMAL LOW (ref 4.22–5.81)
Retic Count, Absolute: 75.7 10*3/uL (ref 19.0–186.0)
Retic Ct Pct: 3.4 % — ABNORMAL HIGH (ref 0.4–3.1)

## 2023-04-06 LAB — TYPE AND SCREEN: Unit division: 0

## 2023-04-06 LAB — BASIC METABOLIC PANEL
Anion gap: 13 (ref 5–15)
BUN: <5 mg/dL — ABNORMAL LOW (ref 6–20)
CO2: 37 mmol/L — ABNORMAL HIGH (ref 22–32)
Calcium: 6.9 mg/dL — ABNORMAL LOW (ref 8.9–10.3)
Chloride: 87 mmol/L — ABNORMAL LOW (ref 98–111)
Creatinine, Ser: 0.69 mg/dL (ref 0.61–1.24)
GFR, Estimated: >60 mL/min (ref >60)
Glucose, Bld: 114 mg/dL — ABNORMAL HIGH (ref 70–99)
Potassium: 2.2 mmol/L — CL (ref 3.5–5.1)
Sodium: 137 mmol/L (ref 135–145)

## 2023-04-06 LAB — FOLATE: Folate: 3.5 ng/mL — ABNORMAL LOW (ref >5.9)

## 2023-04-06 LAB — TSH: TSH: 2.938 u[IU]/mL (ref 0.350–4.500)

## 2023-04-06 LAB — PROTIME-INR
INR: 1.3 — ABNORMAL HIGH (ref 0.8–1.2)
Prothrombin Time: 16 seconds — ABNORMAL HIGH (ref 11.4–15.2)

## 2023-04-06 LAB — ETHANOL: Alcohol, Ethyl (B): <10 mg/dL (ref <10)

## 2023-04-06 LAB — LIPASE, BLOOD: Lipase: 27 U/L (ref 11–51)

## 2023-04-06 LAB — BILIRUBIN, FRACTIONATED(TOT/DIR/INDIR)
Bilirubin, Direct: 0.9 mg/dL — ABNORMAL HIGH (ref 0.0–0.2)
Indirect Bilirubin: 5 mg/dL — ABNORMAL HIGH (ref 0.3–0.9)
Total Bilirubin: 5.9 mg/dL — ABNORMAL HIGH (ref 0.3–1.2)

## 2023-04-06 LAB — APTT: aPTT: 30 seconds (ref 24–36)

## 2023-04-06 LAB — TECHNOLOGIST SMEAR REVIEW: Plt Morphology: NORMAL

## 2023-04-06 LAB — MAGNESIUM: Magnesium: 1.5 mg/dL — ABNORMAL LOW (ref 1.7–2.4)

## 2023-04-06 LAB — BRAIN NATRIURETIC PEPTIDE: B Natriuretic Peptide: 599.6 pg/mL — ABNORMAL HIGH (ref 0.0–100.0)

## 2023-04-06 LAB — AMMONIA: Ammonia: 41 umol/L — ABNORMAL HIGH (ref 9–35)

## 2023-04-06 LAB — LACTIC ACID, PLASMA: Lactic Acid, Venous: 1.2 mmol/L (ref 0.5–1.9)

## 2023-04-06 LAB — PROCALCITONIN: Procalcitonin: <0.1 ng/mL

## 2023-04-06 LAB — ABO/RH: ABO/RH(D): O POS

## 2023-04-06 LAB — TROPONIN I (HIGH SENSITIVITY): Troponin I (High Sensitivity): 12 ng/L (ref <18)

## 2023-04-06 MED ORDER — MAGNESIUM SULFATE IN D5W 1-5 GM/100ML-% IV SOLN
1.0000 g | Freq: Once | INTRAVENOUS | Status: AC
  Administered 2023-04-06: 1 g via INTRAVENOUS
  Filled 2023-04-06: qty 100

## 2023-04-06 MED ORDER — FUROSEMIDE 10 MG/ML IJ SOLN
40.0000 mg | Freq: Once | INTRAMUSCULAR | Status: AC
  Administered 2023-04-06: 40 mg via INTRAVENOUS
  Filled 2023-04-06: qty 4

## 2023-04-06 MED ORDER — SODIUM CHLORIDE 0.9% FLUSH: 3.0000 mL | INTRAVENOUS | Status: DC | PRN

## 2023-04-06 MED ORDER — LORAZEPAM 1 MG PO TABS: 1.0000 mg | ORAL_TABLET | ORAL | Status: AC | PRN

## 2023-04-06 MED ORDER — SODIUM CHLORIDE 0.9 % IV SOLN: 250.0000 mL | INTRAVENOUS | Status: DC | PRN

## 2023-04-06 MED ORDER — SODIUM CHLORIDE 0.9% FLUSH
3.0000 mL | Freq: Two times a day (BID) | INTRAVENOUS | Status: DC
  Administered 2023-04-06 – 2023-04-10 (×8): 3 mL via INTRAVENOUS

## 2023-04-06 MED ORDER — POTASSIUM CHLORIDE 10 MEQ/100ML IV SOLN
10.0000 meq | INTRAVENOUS | Status: AC
  Administered 2023-04-06 – 2023-04-07 (×6): 10 meq via INTRAVENOUS
  Filled 2023-04-06 (×8): qty 100

## 2023-04-06 MED ORDER — COLCHICINE 0.6 MG PO TABS
1.2000 mg | ORAL_TABLET | Freq: Once | ORAL | Status: AC
  Administered 2023-04-06: 1.2 mg via ORAL
  Filled 2023-04-06 (×2): qty 2

## 2023-04-06 MED ORDER — THIAMINE HCL 100 MG/ML IJ SOLN: 100.0000 mg | Freq: Every day | INTRAMUSCULAR | Status: DC

## 2023-04-06 MED ORDER — COLCHICINE 0.6 MG PO TABS
0.6000 mg | ORAL_TABLET | Freq: Once | ORAL | Status: AC
  Administered 2023-04-06: 0.6 mg via ORAL
  Filled 2023-04-06 (×2): qty 1

## 2023-04-06 MED ORDER — ADULT MULTIVITAMIN W/MINERALS CH
1.0000 | ORAL_TABLET | Freq: Every day | ORAL | Status: DC
  Administered 2023-04-06 – 2023-04-10 (×5): 1 via ORAL
  Filled 2023-04-06 (×5): qty 1

## 2023-04-06 MED ORDER — FUROSEMIDE 10 MG/ML IJ SOLN
20.0000 mg | Freq: Once | INTRAMUSCULAR | Status: AC
  Administered 2023-04-06: 20 mg via INTRAVENOUS
  Filled 2023-04-06: qty 4

## 2023-04-06 MED ORDER — POTASSIUM CHLORIDE CRYS ER 20 MEQ PO TBCR
40.0000 meq | EXTENDED_RELEASE_TABLET | Freq: Once | ORAL | Status: AC
  Administered 2023-04-06: 40 meq via ORAL
  Filled 2023-04-06: qty 2

## 2023-04-06 MED ORDER — POTASSIUM CHLORIDE 10 MEQ/100ML IV SOLN
10.0000 meq | Freq: Once | INTRAVENOUS | Status: AC
  Administered 2023-04-06: 10 meq via INTRAVENOUS
  Filled 2023-04-06: qty 100

## 2023-04-06 MED ORDER — ENOXAPARIN SODIUM 40 MG/0.4ML IJ SOSY
40.0000 mg | PREFILLED_SYRINGE | INTRAMUSCULAR | Status: DC
  Administered 2023-04-06 – 2023-04-09 (×4): 40 mg via SUBCUTANEOUS
  Filled 2023-04-06 (×4): qty 0.4

## 2023-04-06 MED ORDER — PANTOPRAZOLE SODIUM 40 MG IV SOLR
40.0000 mg | Freq: Two times a day (BID) | INTRAVENOUS | Status: DC
  Administered 2023-04-06 – 2023-04-10 (×8): 40 mg via INTRAVENOUS
  Filled 2023-04-06 (×8): qty 10

## 2023-04-06 MED ORDER — ONDANSETRON HCL 4 MG PO TABS: 4.0000 mg | ORAL_TABLET | Freq: Four times a day (QID) | ORAL | Status: DC | PRN

## 2023-04-06 MED ORDER — IOHEXOL 300 MG/ML  SOLN
100.0000 mL | Freq: Once | INTRAMUSCULAR | Status: AC | PRN
  Administered 2023-04-06: 100 mL via INTRAVENOUS

## 2023-04-06 MED ORDER — MORPHINE SULFATE (PF) 4 MG/ML IV SOLN
4.0000 mg | Freq: Once | INTRAVENOUS | Status: AC
  Administered 2023-04-06: 4 mg via INTRAVENOUS
  Filled 2023-04-06: qty 1

## 2023-04-06 MED ORDER — OXYCODONE HCL 5 MG PO TABS
10.0000 mg | ORAL_TABLET | Freq: Four times a day (QID) | ORAL | Status: AC | PRN
  Administered 2023-04-07 (×4): 10 mg via ORAL
  Filled 2023-04-06 (×4): qty 2

## 2023-04-06 MED ORDER — SODIUM CHLORIDE 0.9% IV SOLUTION
Freq: Once | INTRAVENOUS | Status: AC
  Filled 2023-04-06: qty 250

## 2023-04-06 MED ORDER — POTASSIUM CHLORIDE CRYS ER 20 MEQ PO TBCR
40.0000 meq | EXTENDED_RELEASE_TABLET | Freq: Two times a day (BID) | ORAL | Status: DC
  Administered 2023-04-06 – 2023-04-10 (×8): 40 meq via ORAL
  Filled 2023-04-06 (×8): qty 2

## 2023-04-06 MED ORDER — ONDANSETRON HCL 4 MG/2ML IJ SOLN
4.0000 mg | Freq: Four times a day (QID) | INTRAMUSCULAR | Status: DC | PRN
  Administered 2023-04-07: 4 mg via INTRAVENOUS
  Filled 2023-04-06: qty 2

## 2023-04-06 MED ORDER — LORAZEPAM 0.5 MG PO TABS: 0.5000 mg | ORAL_TABLET | ORAL | Status: AC | PRN

## 2023-04-06 MED ORDER — POTASSIUM CHLORIDE CRYS ER 20 MEQ PO TBCR
40.0000 meq | EXTENDED_RELEASE_TABLET | ORAL | Status: AC
  Administered 2023-04-06 – 2023-04-07 (×2): 40 meq via ORAL
  Filled 2023-04-06 (×2): qty 2

## 2023-04-06 MED ORDER — THIAMINE MONONITRATE 100 MG PO TABS
100.0000 mg | ORAL_TABLET | Freq: Every day | ORAL | Status: DC
  Administered 2023-04-06 – 2023-04-10 (×5): 100 mg via ORAL
  Filled 2023-04-06 (×5): qty 1

## 2023-04-06 MED ORDER — FOLIC ACID 1 MG PO TABS
1.0000 mg | ORAL_TABLET | Freq: Every day | ORAL | Status: DC
  Administered 2023-04-06 – 2023-04-10 (×5): 1 mg via ORAL
  Filled 2023-04-06 (×5): qty 1

## 2023-04-06 NOTE — ED Provider Notes (Signed)
[facility] Provider Note    Event Date/Time   First MD Initiated Contact with Patient 04/06/23 1233     (approximate)   History   Leg Pain and Shortness of Breath   HPI  Shawn Wolfe is a 47 y.o. male history of polyarticular gout, knee effusions, elevated LFTs B12 deficiency and alcohol abuse  Patient reports that for about a month now he is noticed swelling in both of his lower legs his knees, right greater than left, and swelling up into his abdomen and some shortness of breath developing over the last several days.  First noticed about 2 weeks ago but now he reports its to the point where he feels short of breath, and he is having difficulty with standing and walking due to weakness  He relates that his gout in his knee was relatively well-controlled until he stopped taking medication for a little over a week ago and now it started to flare back up with the return of swelling in the right knee.  It has not been red, he reports he had gout diagnosed there and in his toes multiple times feels the same  In addition, shortness of breath and abdominal swelling is new though over the last month as well as both legs.  He does use alcohol heavily, has been told that he may have a problem with his liver in the past.  Yesterday he had an episode of fairly severe pain in his right flank and "kidney" area but he reports that has resolved.  He is no longer in any pain or discomfort with exception to the "gout" that he reports in his right knee     Physical Exam   Triage Vital Signs: ED Triage Vitals  Enc Vitals Group     BP      Pulse      Resp      Temp      Temp src      SpO2      Weight      Height      Head Circumference      Peak Flow      Pain Score      Pain Loc      Pain Edu?      Excl. in GC?   Patient is noted to have mild to moderate hypoxia on room air, corrects on 2 L nasal cannula  Most recent vital signs: Vitals:   04/06/23 1330  04/06/23 1445  BP:  (!) 142/85  Pulse: 80 82  Resp:  14  Temp:    SpO2: 100% 100%     General: Awake, no distress.  Appears slightly jaundiced CV:  Good peripheral perfusion.  Normal tones and rate. Resp:  Normal effort.  Diminished lung sounds bilaterally, questionably faint crackles in the bases bilaterally. Abd:  No distention.  Moderately distended but not rigid.  Patient reports his abdominal distention is actually better than it was a few days ago.  Denies pain to palpation any quadrant.  Negative Murphy. Other:  Anasarca from mid abdomen down to his feet bilaterally, he does report some discomfort to range of motion of the right knee but it is not warm or erythematous.  There is moderate joint effusion in both knees right greater than left.  Patient reports recurrent history of gout causing same in the past   ED Results / Procedures / Treatments   Labs (all labs ordered are listed, but only abnormal results  are displayed) Labs Reviewed  COMPREHENSIVE METABOLIC PANEL - Abnormal; Notable for the following components:      Result Value   Potassium 2.1 (*)    Chloride 87 (*)    CO2 36 (*)    Glucose, Bld 117 (*)    BUN <5 (*)    Creatinine, Ser 0.52 (*)    Calcium 6.9 (*)    Total Protein 5.4 (*)    Albumin 2.5 (*)    AST 57 (*)    Total Bilirubin 5.7 (*)    All other components within normal limits  CBC WITH DIFFERENTIAL/PLATELET - Abnormal; Notable for the following components:   RBC 2.22 (*)    Hemoglobin 5.8 (*)    HCT 19.4 (*)    MCHC 29.9 (*)    RDW 24.3 (*)    nRBC 2.6 (*)    Lymphs Abs 0.6 (*)    All other components within normal limits  PROTIME-INR - Abnormal; Notable for the following components:   Prothrombin Time 16.0 (*)    INR 1.3 (*)    All other components within normal limits  BRAIN NATRIURETIC PEPTIDE - Abnormal; Notable for the following components:   B Natriuretic Peptide 599.6 (*)    All other components within normal limits  URINALYSIS, W/  REFLEX TO CULTURE (INFECTION SUSPECTED) - Abnormal; Notable for the following components:   Color, Urine YELLOW (*)    APPearance CLEAR (*)    pH 9.0 (*)    All other components within normal limits  AMMONIA - Abnormal; Notable for the following components:   Ammonia 41 (*)    All other components within normal limits  CULTURE, BLOOD (SINGLE)  LACTIC ACID, PLASMA  APTT  LIPASE, BLOOD  ETHANOL  PROCALCITONIN     EKG  And interpreted by me at 1235 heart rate 90 QRS 80 QTc 450 Sinus rhythm, prolonged QT   RADIOLOGY Chest x-ray interpreted by me as cardiomethaly   with possible interstitial infiltrate versus edema      PROCEDURES:  Critical Care performed: Yes, see critical care procedure note(s)  CRITICAL CARE Performed by: Sharyn Creamer   Total critical care time: 30 minutes  Critical care time was exclusive of separately billable procedures and treating other patients.  Critical care was necessary to treat or prevent imminent or life-threatening deterioration.  Critical care was time spent personally by me on the following activities: development of treatment plan with patient and/or surrogate as well as nursing, discussions with consultants, evaluation of patient's response to treatment, examination of patient, obtaining history from patient or surrogate, ordering and performing treatments and interventions, ordering and review of laboratory studies, ordering and review of radiographic studies, pulse oximetry and re-evaluation of patient's condition.  Severe electrolyte depletion including critically low potassium with prolonged QT on EKG.  Procedures   MEDICATIONS ORDERED IN ED: Medications  magnesium sulfate IVPB 1 g 100 mL (has no administration in time range)  potassium chloride SA (KLOR-CON M) CR tablet 40 mEq (has no administration in time range)  potassium chloride 10 mEq in 100 mL IVPB (has no administration in time range)  iohexol (OMNIPAQUE) 300 MG/ML  solution 100 mL (100 mLs Intravenous Contrast Given 04/06/23 1504)     IMPRESSION / MDM / ASSESSMENT AND PLAN / ED COURSE  I reviewed the triage vital signs and the nursing notes.  Differential diagnosis includes, but is not limited to, evidence of significant edema I suspect he may have an element of pulmonary edema with dyspnea, volume overload, possibly ascites, and given his clinical history I suspect this is related to acute liver disease, third spacing, hypoalbuminemia, or other causation such as this.  He does also have joint swelling in both knees reports a history of gout with similar presentation and today he does not have erythema or warmth involving the knee joint I think it is unlikely to represent an acute septic arthritis especially given the fact that he has a history of polyarticular gout with same symptoms in the past recently having stopped taking his medication.  Differential diagnosis remains broad however, we will work him up extensively.  Patient's presentation is most consistent with acute presentation with potential threat to life or bodily function.   The patient is on the cardiac monitor to evaluate for evidence of arrhythmia and/or significant heart rate changes.   Clinical Course as of 04/06/23 1541  Fri Apr 06, 2023  1417 Difficulty obtaining labs, nursing has not been able to obtain send sample [MQ]  1417 Nursing just informing labs sent [MQ]  1504 Discussed with patient, recommended performing a rectal exam to evaluate for blood in stool.  Patient denies this, does not wish to have rectal exam.  Reports she will provide a stool sample when able.  He does however report he has not had any black or bloody stool that he is aware of.  He does have a history of anemia and thalassemia [MQ]  1505 Labs reveal severe electrolyte abnormalities including hypokalemia, hypocalcemia, mild elevation of AST in keeping with suspected underlying  alcoholism and mildly elevated bilirubin.  Hemoglobin is 5.8, reduction from his baseline, he is not hypotensive and he does not exhibit symptoms that suggest acute GI bleeding, but there may be an element of acuity to it.  At this juncture given he is hemodynamically stable, will defer to hospitalist on decision as to whether or not rbc transfusion needed. [MQ]    Clinical Course User Index [MQ] Sharyn Creamer, MD   ----------------------------------------- 3:41 PM on 04/06/2023 ----------------------------------------- I consulted with our hospitalist, Dr. Alvester Morin.  Dr. Alvester Morin will admit the patient.  Understands that CT abdomen pelvis results are still pending at this time.  Patient as well as his girlfriend of 20+ years are understanding and agreeable with plan for admission to the hospital.    FINAL CLINICAL IMPRESSION(S) / ED DIAGNOSES   Final diagnoses:  Hypokalemia  Cardiomegaly  Hypervolemia, unspecified hypervolemia type  Anemia, unspecified type  Transaminitis  Elevated bilirubin     Rx / DC Orders   ED Discharge Orders     None        Note:  This document was prepared using Dragon voice recognition software and may include unintentional dictation errors.   Sharyn Creamer, MD 04/06/23 [phone]

## 2023-04-06 NOTE — Assessment & Plan Note (Addendum)
Hgb 5.8 today with baseline hgb around 8-10  Pt denies any hematesis or blood stools.  Will check hemoccult x 1 Peripheral smear given hyperbilirubinemia  Anemia panel  1u pRBC transfusion  Trend hgb

## 2023-04-06 NOTE — ED Notes (Signed)
Attempting lab draws. MD made aware of delay.

## 2023-04-06 NOTE — Assessment & Plan Note (Signed)
T. bili 5.7 on presentation with noted jaundice Fractionated bilirubin pending Abdominal ultrasound and CT of the abdomen pelvis grossly negative for any acute obstruction or gallbladder distention. Noted biliary sludge present Differential diagnosis fairly broad including hepatic congestion in setting of CHF versus liver disease in the setting of alcohol use Prior bilirubin levels have been around 4 during prior admissions.  LFTs relatively stable apart from AST 57  Check TSH Hepatitis panel pending Peripheral smear given anemia  Inflammatory markers pending Follow GI consult as clinically indicated

## 2023-04-06 NOTE — Assessment & Plan Note (Signed)
Progressively worsening shortness of breath increased work of breathing with transient use of 2 L nasal cannula in the ER to keep O2 sats greater than 88% Suspect secondary to anasarca  Imaging with noted bilateral moderate pleural effusions and trace pericardial effusion IV diuresis in conjunction with management of hypokalemia 2D echo pending No overt infectious process of the lungs noted Monitor respiratory status with diuresis Follow

## 2023-04-06 NOTE — H&P (Addendum)
History and Physical    Patient: Shawn Wolfe WUJ:[id number] DOB: 03-01-1976 DOA: 04/06/2023 DOS: the patient was seen and examined on 04/06/2023 PCP: Pcp, No  Patient coming from: Home  Chief Complaint:  Chief Complaint  Patient presents with   Leg Pain   Shortness of Breath   HPI: Shawn Wolfe is a 47 y.o. male with medical history significant of heavy alcohol abuse, gout, hypertension, thalassemia minor, presenting with anasarca, acute respiratory failure with hypoxia, hypokalemia, hyperbilirubinemia, acute on chronic anemia.  Patient reports progressive shortness of breath over the past 1 to 2 weeks.  Predominantly over the past 5 days.  Patient states he has noted significant abdominal as well as lower extremity swelling over the same timeframe.  Positive orthopnea and PND.  Patient states he has not been able to lay down flat for multiple weeks.  No prior history of heart failure or shortness of breath in the past.  Patient states he has been drinking least 12+ concentrated alcohol beers daily as well as mixed drinks and some liquor.  No reports of abdominal pain nausea, hematemesis or black or bloody stools.  Has had worsening pain in the knees bilaterally as well as the great toe.  Pain distribution is similar to prior polyarticular gout flares in the past.  Was previously taking allopurinol.  Has been noncompliant with use.  No fevers or chills.  Patient reports taking high-dose NSAIDs up to eight 200 mg ibuprofen daily to help with gout pain. Presented to the ER temp 99, heart rate in the 80s, BP stable, satting initially 88% on room air, transition to 2 L nasal cannula.  Notable labs include white count 5.4, hemoglobin 5.8 with a baseline hemoglobin around 8-10, platelets 152, lactate 1.2, creatinine 0.52, potassium 2.1, calcium 6.9, AST 57, ALT 30, bilirubin 5.7, INR 1.3, lipase 27, BNP of 600, urinalysis stable.  Chest x-ray with cardiomegaly and increased interstitial markings  concerning for edema versus pneumonia.  Abdominal ultrasound with mild amount of sludge seen within the gallbladder lumen.  Hepatic steatosis.  CT abdomen pelvis with small to moderate bilateral pleural effusions and trace pericardial effusion, body wall edema. Review of Systems: As mentioned in the history of present illness. All other systems reviewed and are negative. Past Medical History:  Diagnosis Date   Gout    Hypertension    Thalassemia minor    Past Surgical History:  Procedure Laterality Date   HAND SURGERY Right    mutiple fractures requiring ORIF - pins later removed.   Social History:  reports that he has never smoked. He uses smokeless tobacco. He reports current alcohol use of about 42.0 standard drinks of alcohol per week. He reports that he does not use drugs.  Allergies  Allergen Reactions   Sulfa Antibiotics Hives   Latex Rash    Family History  Problem Relation Age of Onset   Hypertension Mother    Liver disease Mother    Cancer Father    Cancer Sister    Hypertension Brother     Prior to Admission medications   Medication Sig Start Date End Date Taking? Authorizing Provider  colchicine 0.6 MG tablet Take one tablet BID until gout is gone. Thereafter, Shawn take as needed for gout flare: take 1.2 mg x1, followed by 0.6mg  x1. 10/11/21  Yes Standley Brooking, MD  lisinopril (ZESTRIL) 20 MG tablet Take 1 tablet (20 mg total) by mouth daily. 10/12/21  Yes Standley Brooking, MD  Multiple Vitamin (MULTIVITAMIN WITH  MINERALS) TABS tablet Take 1 tablet by mouth daily. 10/11/21  Yes Standley Brooking, MD  cyanocobalamin (,VITAMIN B-12,) 1000 MCG/ML injection One dose q7 days x3 doses, first dose 10/24 Patient not taking: Reported on 04/06/2023 10/11/21   Standley Brooking, MD  Iron, Ferrous Sulfate, 325 (65 Fe) MG TABS Take 325 mg by mouth daily. Patient not taking: Reported on 04/06/2023 10/11/21   Standley Brooking, MD  thiamine 100 MG tablet Take 1 tablet (100  mg total) by mouth daily. Patient not taking: Reported on 04/06/2023 10/12/21   Standley Brooking, MD  traMADol (ULTRAM) 50 MG tablet Take 1 tablet (50 mg total) by mouth every 6 (six) hours as needed. Patient not taking: Reported on 04/06/2023 02/26/23 02/26/24  Jene Every, MD    Physical Exam: Vitals:   04/06/23 1300 04/06/23 1315 04/06/23 1330 04/06/23 1445  BP:    (!) 142/85  Pulse: 85 82 80 82  Resp: 19 14  14   Temp:      TempSrc:      SpO2: 100% 100% 100% 100%  Weight:      Height:       Physical Exam Constitutional:      Appearance: He is obese.  HENT:     Head: Normocephalic and atraumatic.     Nose: Nose normal.     Mouth/Throat:     Mouth: Mucous membranes are moist.  Eyes:     Comments: Positive jaundice  Cardiovascular:     Rate and Rhythm: Normal rate and regular rhythm.  Pulmonary:     Effort: Pulmonary effort is normal.     Breath sounds: Wheezing present.  Abdominal:     General: Bowel sounds are normal.     Comments: + abdominal distention  Musculoskeletal:        General: Normal range of motion.     Cervical back: Normal range of motion.     Comments: Positive bilateral knee swelling/effusions 2-3+ pitting edema bilaterally in distal extremities  Skin:    General: Skin is warm.  Neurological:     General: No focal deficit present.  Psychiatric:        Mood and Affect: Mood normal.     Data Reviewed:  There are no new results to review at this time. CT ABDOMEN PELVIS W CONTRAST CLINICAL DATA:  Abdominal and flank pain. Stone suspected. Bilateral leg pain and swelling.  EXAM: CT ABDOMEN AND PELVIS WITH CONTRAST  TECHNIQUE: Multidetector CT imaging of the abdomen and pelvis was performed using the standard protocol following bolus administration of intravenous contrast.  RADIATION DOSE REDUCTION: This exam was performed according to the departmental dose-optimization program which includes automated exposure control, adjustment of the  mA and/or kV according to patient size and/or use of iterative reconstruction technique.  CONTRAST:  OMNIPAQUE IOHEXOL 300 MG/ML  SOLN  COMPARISON:  None Available.  FINDINGS: Lower chest: There is dependent atelectasis with small to moderate bilateral pleural effusions. Trace pericardial effusion evident.  Hepatobiliary: No suspicious focal abnormality within the liver parenchyma. Gallbladder is nondistended. No intrahepatic or extrahepatic biliary dilation.  Pancreas: No focal mass lesion. No dilatation of the main duct. No intraparenchymal cyst. No peripancreatic edema.  Spleen: No focal abnormality in the spleen.  Adrenals/Urinary Tract: No adrenal nodule or mass. Kidneys unremarkable. No evidence for hydroureter. The urinary bladder appears normal for the degree of distention.  Stomach/Bowel: Stomach is unremarkable. No gastric wall thickening. No evidence of outlet obstruction. Duodenum is normally  positioned as is the ligament of Treitz. No small bowel wall thickening. No small bowel dilatation. The terminal ileum is normal. The appendix is normal. No gross colonic mass. No colonic wall thickening.  Vascular/Lymphatic: There is moderate atherosclerotic calcification of the abdominal aorta without aneurysm. There is no gastrohepatic or hepatoduodenal ligament lymphadenopathy. No retroperitoneal or mesenteric lymphadenopathy. No pelvic sidewall lymphadenopathy.  Reproductive: The prostate gland and seminal vesicles are unremarkable.  Other: Trace free fluid is seen in the pelvis.  Musculoskeletal: Small bilateral groin hernias contain only fat. Body wall edema noted lower abdomen and pelvis. No worrisome lytic or sclerotic osseous abnormality.  IMPRESSION: 1. No acute findings in the abdomen or pelvis. Specifically, no findings to explain the patient's history of abdominal pain. 2. Small to moderate bilateral pleural effusions with trace pericardial  effusion. 3. Trace free fluid in the pelvis. This is technically abnormal in a male patient although etiology for this fluid is not evident. 4. Body wall edema. 5. Small bilateral groin hernias contain only fat. 6.  Aortic Atherosclerosis (ICD10-I70.0).  Electronically Signed   By: Kennith Center M.D.   On: 04/06/2023 15:39 US Abdomen Limited RUQ (LIVER/GB) CLINICAL DATA:  Abnormal liver function tests.  EXAM: ULTRASOUND ABDOMEN LIMITED RIGHT UPPER QUADRANT  COMPARISON:  October 09, 2021.  FINDINGS: Gallbladder:  No cholelithiasis is noted. Gallbladder appears to be mildly contracted. Mild amount of sludge is noted. No sonographic Murphy's sign.  Common bile duct:  Diameter: 4 mm which is within normal limits.  Liver:  No focal lesion identified. Increased echogenicity of hepatic parenchyma is noted. Portal vein is patent on color Doppler imaging with normal direction of blood flow towards the liver.  Other: None.  IMPRESSION: Mild amount of sludge seen within gallbladder lumen. Probable hepatic steatosis.  Electronically Signed   By: Lupita Raider M.D.   On: 04/06/2023 14:02 DG Chest 2 View CLINICAL DATA:  Dyspnea  EXAM: CHEST - 2 VIEW  COMPARISON:  10/09/2021  FINDINGS: Transverse diameter of heart is increased. Central pulmonary vessels are prominent. Increased interstitial markings are seen in lower lung fields. There is blunting of both posterior costophrenic angles. There is no pneumothorax.  IMPRESSION: Cardiomegaly. Increased interstitial markings are seen in lower lung fields suggesting interstitial edema or interstitial pneumonia. Small bilateral pleural effusions.  Electronically Signed   By: Ernie Avena M.D.   On: 04/06/2023 13:18  Lab Results  Component Value Date   WBC 5.4 04/06/2023   HGB 5.8 (L) 04/06/2023   HCT 19.4 (L) 04/06/2023   MCV 87.4 04/06/2023   PLT 152 04/06/2023   Last metabolic panel Lab Results   Component Value Date   GLUCOSE 117 (H) 04/06/2023   NA 137 04/06/2023   K 2.1 (LL) 04/06/2023   CL 87 (L) 04/06/2023   CO2 36 (H) 04/06/2023   BUN <5 (L) 04/06/2023   CREATININE 0.52 (L) 04/06/2023   GFRNONAA >60 04/06/2023   CALCIUM 6.9 (L) 04/06/2023   PROT 5.4 (L) 04/06/2023   ALBUMIN 2.5 (L) 04/06/2023   BILITOT 5.7 (H) 04/06/2023   ALKPHOS 40 04/06/2023   AST 57 (H) 04/06/2023   ALT 30 04/06/2023   ANIONGAP 14 04/06/2023    Assessment and Plan: * Anasarca Patient diffusely edematous from the abdomen into the lower extremities with progressive DOE,orthopnea, PND  Regular heavy ETOH use (12+ alcohol concentrated beers daily + mixed drinks)  Heavy NSAID use in setting of baseline gout (8; 200mg  Ibuprofen daily) Positive abdominal distention 2-3+  pitting edema bilaterally BNP 600 with cardiomegaly on chest x-ray CT abdomen pelvis with noted small to moderate bilateral pleural effusions No ascites or cirrhosis on imaging in the setting of longstanding alcohol abuse Some ? 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 Mariah Milling w/ cardiology notified with plan for formal cardiology consult in am       Hyperbilirubinemia T. bili 5.7 on presentation with noted jaundice Fractionated bilirubin pending Abdominal ultrasound and CT of the abdomen pelvis grossly negative for any acute obstruction or gallbladder distention. Noted biliary sludge present Differential diagnosis fairly broad including hepatic congestion in setting of CHF versus liver disease in the setting of alcohol use Prior bilirubin levels have been around 4 during prior admissions.  LFTs relatively stable apart from AST 57  Check TSH Hepatitis panel  pending Peripheral smear given anemia  Inflammatory markers pending Follow GI consult as clinically indicated    Hypokalemia K2.1 on presentation the setting of decreased p.o. intake associated with severe alcohol use Pending aggressive repletion as patient also requires IV diuresis for anasarca Pending pRBC transfusion which may temporize repletion  Check magnesium level  Follow closely  Acute respiratory failure with hypoxia Progressively worsening shortness of breath increased work of breathing with transient use of 2 L nasal cannula in the ER to keep O2 sats greater than 88% Suspect secondary to anasarca  Imaging with noted bilateral moderate pleural effusions and trace pericardial effusion IV diuresis in conjunction with management of hypokalemia 2D echo pending No overt infectious process of the lungs noted Monitor respiratory status with diuresis Follow  Thalassemia minor Hgb 5.8 today with baseline hgb around 8-10  Pt denies any hematesis or blood stools.  Will check hemoccult x 1 Peripheral smear given hyperbilirubinemia  Anemia panel  1u pRBC transfusion  Trend hgb    Alcohol abuse 12+ alcohol concentrated beer drinker plus mixed drinks Alcohol level less than 10 Patient is considering detox CIWA protocol UDS pending IV PPI  Follow  Alcoholic liver disease Abdominal ultrasound with probable hepatic steatosis in the setting of heavy alcohol use No overt cirrhosis, portal hypertension or ascites Noted jaundice T. bili 5.7, INR 1.3, platelets 152 MELD score 16   Gout Active polyarticular gout flare involving bilateral knees and left great toe Patient not taking allopurinol on a consistent basis Colchicine for acute treatment Defer NSAIDs and Solu-Medrol for now given anasarca      Advance Care Planning:   Code Status: Full Code   Consults: Cardiology w/ Dr. Mariah Milling   Family Communication: No family at the bedside   Greater than 50% was spent in  counseling and coordination of care with patient Total encounter time 80 minutes or more   Severity of Illness: The appropriate patient status for this patient is INPATIENT. Inpatient status is judged to be reasonable and necessary in order to provide the required intensity of service to ensure the patient's safety. The patient's presenting symptoms, physical exam findings, and initial radiographic and laboratory data in the context of their chronic comorbidities is felt to place them at high risk for further clinical deterioration. Furthermore, it is not anticipated that the patient will be medically stable for discharge from the hospital within 2 midnights of admission.   *  I certify that at the point of admission it is my clinical judgment that the patient will require inpatient hospital care spanning beyond 2 midnights from the point of admission due to high intensity of service, high risk for further deterioration and high frequency of surveillance required.*  Author: Floydene Flock, MD 04/06/2023 5:34 PM  For on call review [email or web address].

## 2023-04-06 NOTE — BH Assessment (Signed)
TTS provided patient with resources for substance abuse treatment.  _________________________________________________ [facility] [address] [place] [phone]  [facility] [address] [place], [place], [place] [phone]  [facility] [address]  [place], [place] [address] [phone]   [facility] [address]  [place], [place] [address] [phone]  [facility] [address]  [place], [place] [address] [phone]  [facility] [address],  [place], [place] [address] [phone]

## 2023-04-06 NOTE — Assessment & Plan Note (Addendum)
Patient diffusely edematous from the abdomen into the lower extremities with progressive DOE,orthopnea, PND  Regular heavy ETOH use (12+ alcohol concentrated beers daily + mixed drinks)  Heavy NSAID use in setting of baseline gout (8; 200mg  Ibuprofen daily) Positive abdominal distention 2-3+ pitting edema bilaterally BNP 600 with cardiomegaly on chest x-ray CT abdomen pelvis with noted small to moderate bilateral pleural effusions No ascites or cirrhosis on imaging in the setting of longstanding alcohol abuse Some ? 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 Mariah Milling w/ cardiology notified with plan for formal cardiology consult in am

## 2023-04-06 NOTE — ED Triage Notes (Signed)
Pt. To ED via EMS for bilateral leg pain and swelling x5 days. Pt. States he has intermittent abdominal swelling as well. Pt. State he drinks approx. 12, 10% alcohol beers and "a few cocktails as well" per day. Pt. States last alcoholic drink was day before yesterday, denies ever having withdrawal symptoms. Pt. Appears SOB with small amt exertion in triage.

## 2023-04-06 NOTE — Assessment & Plan Note (Signed)
Abdominal ultrasound with probable hepatic steatosis in the setting of heavy alcohol use No overt cirrhosis, portal hypertension or ascites Noted jaundice T. bili 5.7, INR 1.3, platelets 152 MELD score 16

## 2023-04-06 NOTE — Assessment & Plan Note (Addendum)
12+ alcohol concentrated beer drinker plus mixed drinks Alcohol level less than 10 Patient is considering detox CIWA protocol UDS pending IV PPI  Follow

## 2023-04-06 NOTE — Assessment & Plan Note (Signed)
Active polyarticular gout flare involving bilateral knees and left great toe Patient not taking allopurinol on a consistent basis Colchicine for acute treatment Defer NSAIDs and Solu-Medrol for now given anasarca

## 2023-04-06 NOTE — Assessment & Plan Note (Signed)
K2.1 on presentation the setting of decreased p.o. intake associated with severe alcohol use Pending aggressive repletion as patient also requires IV diuresis for anasarca Pending pRBC transfusion which may temporize repletion  Check magnesium level  Follow closely

## 2023-04-06 NOTE — Assessment & Plan Note (Addendum)
T. bili 5.7 on presentation with noted jaundice Fractionated bilirubin pending Abdominal ultrasound and CT of the abdomen pelvis grossly negative for any acute obstruction or gallbladder distention. Noted biliary sludge present Differential diagnosis fairly broad including hepatic congestion in setting of CHF versus liver disease in the setting of alcohol use Prior bilirubin levels have been around 4 during prior admissions.  LFTs relatively stable apart from AST 57  Check TSH Hepatitis panel pending Inflammatory markers pending Follow GI consult as clinically indicated

## 2023-04-06 NOTE — BH Assessment (Signed)
Comprehensive Clinical Assessment (CCA) Screening, Triage and Referral Note  04/06/2023 [name] [id number]  Chief Complaint:  Chief Complaint  Patient presents with   Leg Pain   Shortness of Breath   Visit Diagnosis: Alcohol Use Disorder  Shawn Wolfe is a 47 year old male who presents to the ER due to his alcohol use. Patient denies SI/HI and AV/H. He was aware he was going to be admitted medically.  TTS provided patient with resources for substance abuse treatment.  Patient Reported Information How did you hear about [place]? No data recorded What Is the Reason for Your Visit/Call Today? Seeking help for his alcohol use.  How Long Has This Been Causing You Problems? 1-6 months  What Do You Feel Would Help You the Most Today? Alcohol or Drug Use Treatment   Have You Recently Had Any Thoughts About Hurting Yourself? No  Are You Planning to Commit Suicide/Harm Yourself At This time? No   Have you Recently Had Thoughts About Hurting Someone [name]? No  Are You Planning to Harm Someone at This Time? No  Explanation: No data recorded  Have You Used Any Alcohol or Drugs in the Past 24 Hours? Yes  How Long Ago Did You Use Drugs or Alcohol? No data recorded What Did You Use and How Much? Alcohol   Do You Currently Have a Therapist/Psychiatrist? No  Name of Therapist/Psychiatrist: No data recorded  Have You Been Recently Discharged From Any Office Practice or Programs? No  Explanation of Discharge From Practice/Program: No data recorded   CCA Screening Triage Referral Assessment Type of Contact: Face-to-Face  Telemedicine Service Delivery:   Is this Initial or Reassessment?   Date Telepsych consult ordered in [facility]:    Time Telepsych consult ordered in [facility]:    Location of Assessment: [facility] ED  Provider Location: [facility] ED    Collateral Involvement: No data recorded  Does Patient Have a Court Appointed Legal Guardian? No data recorded Name and Contact of  Legal Guardian: No data recorded If Minor and Not Living with Parent(s), Who has Custody? No data recorded Is CPS involved or ever been involved? Never  Is APS involved or ever been involved? Never   Patient Determined To Be At Risk for Harm To Self or Others Based on Review of Patient Reported Information or Presenting Complaint? No  Method: No data recorded Availability of Means: No data recorded Intent: No data recorded Notification Required: No data recorded Additional Information for Danger to Others Potential: No data recorded Additional Comments for Danger to Others Potential: No data recorded Are There Guns or Other Weapons in Your Home? No  Types of Guns/Weapons: No data recorded Are These Weapons Safely Secured?                            No data recorded Who Could Verify You Are Able To Have These Secured: No data recorded Do You Have any Outstanding Charges, Pending Court Dates, Parole/Probation? No data recorded Contacted To Inform of Risk of Harm To Self or Others: No data recorded  Does Patient Present under Involuntary Commitment? No   County of Residence: [place]   Patient Currently Receiving the Following Services: Not Receiving Services   Determination of Need: Emergent (2 hours)   Options For Referral: ED Visit   Discharge Disposition:    Lilyan Gilford MS, LCAS, [facility] Therapeutic Triage Specialist 04/06/2023 3:50 PM

## 2023-04-06 NOTE — Assessment & Plan Note (Addendum)
Hgb 5.8 today with baseline hgb around 8-10  Pt denies any hematesis or blood stools.  Will check hemoccult x 1 Anemia panel  1u pRBC transfusion  Trend hgb

## 2023-04-07 ENCOUNTER — Encounter: Payer: Self-pay | Admitting: Family Medicine

## 2023-04-07 ENCOUNTER — Inpatient Hospital Stay
Admit: 2023-04-07 | Discharge: 2023-04-07 | Disposition: A | Discharge status: Home / Self Care | Payer: Medicaid Other | Attending: Family Medicine | Admitting: Family Medicine

## 2023-04-07 DIAGNOSIS — R7401 Elevation of levels of liver transaminase levels: Secondary | ICD-10-CM | POA: Diagnosis not present

## 2023-04-07 DIAGNOSIS — D649 Anemia, unspecified: Secondary | ICD-10-CM

## 2023-04-07 DIAGNOSIS — R601 Generalized edema: Secondary | ICD-10-CM | POA: Diagnosis not present

## 2023-04-07 DIAGNOSIS — I517 Cardiomegaly: Secondary | ICD-10-CM | POA: Diagnosis not present

## 2023-04-07 DIAGNOSIS — E876 Hypokalemia: Secondary | ICD-10-CM | POA: Diagnosis not present

## 2023-04-07 DIAGNOSIS — J9601 Acute respiratory failure with hypoxia: Secondary | ICD-10-CM | POA: Diagnosis not present

## 2023-04-07 LAB — TYPE AND SCREEN: Antibody Screen: NEGATIVE

## 2023-04-07 LAB — URINE DRUG SCREEN, QUALITATIVE (ARMC ONLY)
Amphetamines, Ur Screen: NOT DETECTED
Barbiturates, Ur Screen: NOT DETECTED
Benzodiazepine, Ur Scrn: NOT DETECTED
Cannabinoid 50 Ng, Ur ~~LOC~~: NOT DETECTED
Cocaine Metabolite,Ur ~~LOC~~: NOT DETECTED
MDMA (Ecstasy)Ur Screen: NOT DETECTED
Methadone Scn, Ur: NOT DETECTED
Opiate, Ur Screen: NOT DETECTED
Phencyclidine (PCP) Ur S: NOT DETECTED
Tricyclic, Ur Screen: NOT DETECTED

## 2023-04-07 LAB — COMPREHENSIVE METABOLIC PANEL
ALT: 29 U/L (ref 0–44)
AST: 49 U/L — ABNORMAL HIGH (ref 15–41)
Albumin: 2.3 g/dL — ABNORMAL LOW (ref 3.5–5.0)
Alkaline Phosphatase: 38 U/L (ref 38–126)
Anion gap: 9 (ref 5–15)
BUN: <5 mg/dL — ABNORMAL LOW (ref 6–20)
CO2: 35 mmol/L — ABNORMAL HIGH (ref 22–32)
Calcium: 6.8 mg/dL — ABNORMAL LOW (ref 8.9–10.3)
Chloride: 95 mmol/L — ABNORMAL LOW (ref 98–111)
Creatinine, Ser: 0.57 mg/dL — ABNORMAL LOW (ref 0.61–1.24)
GFR, Estimated: >60 mL/min (ref >60)
Glucose, Bld: 100 mg/dL — ABNORMAL HIGH (ref 70–99)
Potassium: 2.8 mmol/L — ABNORMAL LOW (ref 3.5–5.1)
Sodium: 139 mmol/L (ref 135–145)
Total Bilirubin: 4.9 mg/dL — ABNORMAL HIGH (ref 0.3–1.2)
Total Protein: 5.1 g/dL — ABNORMAL LOW (ref 6.5–8.1)

## 2023-04-07 LAB — CBC
HCT: 20.1 % — ABNORMAL LOW (ref 39.0–52.0)
Hemoglobin: 6.1 g/dL — ABNORMAL LOW (ref 13.0–17.0)
MCH: 26.6 pg (ref 26.0–34.0)
MCHC: 30.3 g/dL (ref 30.0–36.0)
MCV: 87.8 fL (ref 80.0–100.0)
Platelets: 124 10*3/uL — ABNORMAL LOW (ref 150–400)
RBC: 2.29 MIL/uL — ABNORMAL LOW (ref 4.22–5.81)
RDW: 22.1 % — ABNORMAL HIGH (ref 11.5–15.5)
WBC: 5 10*3/uL (ref 4.0–10.5)
nRBC: 2.8 % — ABNORMAL HIGH (ref 0.0–0.2)

## 2023-04-07 LAB — ECHOCARDIOGRAM COMPLETE
Height: 66 in
Weight: 2960 oz

## 2023-04-07 LAB — HEPATITIS PANEL, ACUTE
HCV Ab: NONREACTIVE
Hep A IgM: NONREACTIVE
Hep B C IgM: NONREACTIVE
Hepatitis B Surface Ag: NONREACTIVE

## 2023-04-07 LAB — BPAM RBC: Blood Product Expiration Date: 202405162359

## 2023-04-07 LAB — PREPARE RBC (CROSSMATCH)

## 2023-04-07 LAB — HIV ANTIBODY (ROUTINE TESTING W REFLEX): HIV Screen 4th Generation wRfx: NONREACTIVE

## 2023-04-07 LAB — VITAMIN B12: Vitamin B-12: 1152 pg/mL — ABNORMAL HIGH (ref 180–914)

## 2023-04-07 LAB — CULTURE, BLOOD (SINGLE)

## 2023-04-07 LAB — POTASSIUM: Potassium: 3.2 mmol/L — ABNORMAL LOW (ref 3.5–5.1)

## 2023-04-07 LAB — MAGNESIUM: Magnesium: 1.6 mg/dL — ABNORMAL LOW (ref 1.7–2.4)

## 2023-04-07 MED ORDER — HYDRALAZINE HCL 20 MG/ML IJ SOLN: 10.0000 mg | Freq: Four times a day (QID) | INTRAMUSCULAR | Status: DC | PRN

## 2023-04-07 MED ORDER — POTASSIUM CHLORIDE 20 MEQ PO PACK
40.0000 meq | PACK | Freq: Once | ORAL | Status: AC
  Administered 2023-04-07: 40 meq via ORAL
  Filled 2023-04-07: qty 2

## 2023-04-07 MED ORDER — SODIUM CHLORIDE 0.9% IV SOLUTION: Freq: Once | INTRAVENOUS | Status: DC

## 2023-04-07 MED ORDER — LOSARTAN POTASSIUM 25 MG PO TABS
12.5000 mg | ORAL_TABLET | Freq: Every day | ORAL | Status: DC
  Administered 2023-04-07 – 2023-04-10 (×4): 12.5 mg via ORAL
  Filled 2023-04-07 (×4): qty 1

## 2023-04-07 MED ORDER — FUROSEMIDE 10 MG/ML IJ SOLN
40.0000 mg | Freq: Once | INTRAMUSCULAR | Status: AC
  Administered 2023-04-07: 40 mg via INTRAVENOUS
  Filled 2023-04-07: qty 4

## 2023-04-07 MED ORDER — MAGNESIUM SULFATE 2 GM/50ML IV SOLN
2.0000 g | Freq: Once | INTRAVENOUS | Status: AC
  Administered 2023-04-07: 2 g via INTRAVENOUS
  Filled 2023-04-07: qty 50

## 2023-04-07 MED ORDER — FUROSEMIDE 10 MG/ML IJ SOLN
80.0000 mg | Freq: Two times a day (BID) | INTRAMUSCULAR | Status: DC
  Administered 2023-04-07 – 2023-04-10 (×7): 80 mg via INTRAVENOUS
  Filled 2023-04-07 (×7): qty 8

## 2023-04-07 MED ORDER — CALCIUM GLUCONATE-NACL 1-0.675 GM/50ML-% IV SOLN
1.0000 g | Freq: Once | INTRAVENOUS | Status: AC
  Administered 2023-04-07: 1000 mg via INTRAVENOUS
  Filled 2023-04-07: qty 50

## 2023-04-07 MED ORDER — POTASSIUM CHLORIDE 10 MEQ/100ML IV SOLN
10.0000 meq | INTRAVENOUS | Status: AC
  Administered 2023-04-07 (×4): 10 meq via INTRAVENOUS
  Filled 2023-04-07 (×4): qty 100

## 2023-04-07 MED ORDER — DAPAGLIFLOZIN PROPANEDIOL 10 MG PO TABS
10.0000 mg | ORAL_TABLET | Freq: Every day | ORAL | Status: DC
  Administered 2023-04-07 – 2023-04-10 (×4): 10 mg via ORAL
  Filled 2023-04-07 (×4): qty 1

## 2023-04-07 MED ORDER — POTASSIUM CHLORIDE CRYS ER 20 MEQ PO TBCR
40.0000 meq | EXTENDED_RELEASE_TABLET | Freq: Once | ORAL | Status: AC
  Administered 2023-04-07: 40 meq via ORAL
  Filled 2023-04-07: qty 2

## 2023-04-07 MED ORDER — FUROSEMIDE 10 MG/ML IJ SOLN
20.0000 mg | Freq: Once | INTRAMUSCULAR | Status: DC
  Filled 2023-04-07: qty 2

## 2023-04-07 NOTE — Progress Notes (Signed)
PROGRESS NOTE    Shawn Wolfe  ZOX:[id number] DOB: 1976-05-29 DOA: 04/06/2023 PCP: Pcp, No   Brief Narrative: This 47 y.o. male with PMH  significant of heavy alcohol abuse, gout, hypertension, thalassemia minor, presented in the ED with anasarca, acute hypoxic respiratory failure, hypokalemia, hyperbilirubinemia, acute on chronic anemia.  Patient reports progressive shortness of breath over the past 1 to 2 weeks.  He reports abdominal and lower extremity swelling associated with orthopnea and PND.  Patient has not been able to lay flat for multiple weeks.  He has been drinking at least 12+ concentrated alcohol beers daily as well as mixed drinks and some liquor.  He has been noncompliant with his allopurinol.  Patient reports taking high-dose NSAIDs for pain relief.  Significant labs in the ED hemoglobin 5.8, potassium 2.1, calcium 6.9, BNP 600, chest x-ray shows cardiomegaly with increased institutional markings concerning for edema versus pneumonia.  Abdominal ultrasound gallbladder wall sludge and.  CT abdomen and pelvis shows small to moderate bilateral pleural effusion and trace pericardial effusion.  Patient is admitted for further evaluation.  Assessment & Plan:   Principal Problem:   Anasarca Active Problems:   Hypokalemia   Hyperbilirubinemia   Alcohol abuse   Thalassemia minor   Acute respiratory failure with hypoxia   Alcoholic liver disease   Gout  Anasarca: Patient appears diffusely edematous from abdomen to lower extremities with progressive orthopnea, PND, DOE Patient reports heavy EtOH use (12+ alcohol concentrated beers daily + mixed drinks)  Heavy NSAID use in the setting of baseline gout. BNP 600 with cardiomegaly on chest x-ray. CT A/P  with small to moderate bilateral pleural effusions No ascites or cirrhosis on imaging in the setting of longstanding alcohol abuse. 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 Continue Lasix 80 mg IV every 12  hours Obtain 2D echocardiogram. Follow-up urine drug screen. Cardiology is consulted awaiting recommendation.   Hyperbilirubinemia: T. bili 5.7 on presentation with noted jaundice. Abdominal ultrasound and CT of the abdomen pelvis grossly negative for any acute obstruction or gallbladder distention. Differential diagnosis fairly broad including hepatic congestion in setting of CHF versus liver disease in the setting of alcohol use LFTs relatively stable apart from AST 57  Hepatitis panel pending Consider GI consult is indicated.   Hypokalemia: Replaced.  Continue to monitor.   Acute hypoxic respiratory failure: Patient presented with progressively worsening shortness of breath, increased work of breathing. Continue supplemental oxygen and wean as tolerated Suspect secondary to anasarca . Chest x-ray shows bilateral moderate pleural effusion and trace pericardial effusion. Continue IV diuresis. 2D echo pending No overt infectious process of lungs noted.  Thalassemia minor: Hgb 5.8 today with baseline hgb around 8-10 . Pt denies any hematesis or blood stools.  Obtain a stool for occult bleeding Peripheral smear given hyperbilirubinemia  Transfuse 2 unit PRBC.  Follow-up H&H.   Alcohol abuse: Patient reports heavy alcohol intake. Alcohol level less than 10 Patient is considering detox Continue CIWA protocol.  Urine drug screen pending Continue IV PPI.   Alcoholic liver disease: Abdominal ultrasound with probable hepatic steatosis in the setting of heavy alcohol use. No overt cirrhosis, portal hypertension or ascites Noted jaundice T. bili 5.7, INR 1.3, platelets 152 MELD score 16   Gout: Active polyarticular gout flare involving bilateral knees and left great toe. Patient not taking allopurinol on a consistent basis. Colchicine for acute treatment Defer NSAIDs and Solu-Medrol for now given anasarca     DVT prophylaxis:  Lovenox Code Status: Full code Family  Communication: No family at bed side. Disposition Plan:     Status is: Inpatient Remains inpatient appropriate because: Admitted for abdominal distention,  alcohol abuse and generalized anasarca.   Consultants:  Cardiology  Procedures: US Abdomen, CT Abdomen and Pelvis Antimicrobials:  Anti-infectives (From admission, onward)    None      Subjective: Patient was seen and examined at bedside.  Overnight events noted. Patient reports feeling much improved since he came in hospital.   He is on 2 L of oxygen , at baseline he does not use oxygen.  Patient denies any pain.  Objective: Vitals:   04/07/23 0321 04/07/23 0800 04/07/23 0825 04/07/23 0852  BP: 121/78 130/88 124/82 (!) 161/94  Pulse: 80 88 81 84  Resp: 15 18  18   Temp: 98.4 F (36.9 C) 98.6 F (37 C) 98.6 F (37 C) 98.3 F (36.8 C)  TempSrc: Oral  Oral Oral  SpO2: 97% 96% 96% 98%  Weight:      Height:        Intake/Output Summary (Last 24 hours) at 04/07/2023 1032 Last data filed at 04/07/2023 0957 Gross per 24 hour  Intake 1392.52 ml  Output 2620 ml  Net -1227.48 ml   Filed Weights   04/06/23 1244  Weight: 83.9 kg    Examination:  General exam: Appears calm and comfortable, deconditioned, not in any distress Respiratory system: CTA bilaterally, respiratory for normal, RR 16 Cardiovascular system: S1 & S2 heard, RRR. No JVD, murmurs, rubs, gallops or clicks.  Gastrointestinal system: Abdomen is soft, distended, nontender, BS+ Central nervous system: Alert and oriented x 3. No focal neurological deficits. Extremities: Edema+, no cyanosis, no clubbing  Skin: No rashes, lesions or ulcers Psychiatry: Judgement and insight appear normal. Mood & affect appropriate.     Data Reviewed: I have personally reviewed following labs and imaging studies  CBC: Recent Labs  Lab 04/06/23 1407 04/07/23 0434  WBC 5.4 5.0  NEUTROABS 4.3  --   HGB 5.8* 6.1*  HCT 19.4* 20.1*  MCV 87.4 87.8  PLT 152 124*    Basic Metabolic Panel: Recent Labs  Lab 04/06/23 1407 04/06/23 1933 04/06/23 2035 04/07/23 0431 04/07/23 0434  NA 137  --  137  --  139  K 2.1*  --  2.2*  --  2.8*  CL 87*  --  87*  --  95*  CO2 36*  --  37*  --  35*  GLUCOSE 117*  --  114*  --  100*  BUN <5*  --  <5*  --  <5*  CREATININE 0.52*  --  0.69  --  0.57*  CALCIUM 6.9*  --  6.9*  --  6.8*  MG  --  1.5*  --  1.6*  --    GFR: Estimated Creatinine Clearance: 114.7 mL/min (A) (by C-G formula based on SCr of 0.57 mg/dL (L)). Liver Function Tests: Recent Labs  Lab 04/06/23 1407 04/06/23 1933 04/07/23 0434  AST 57*  --  49*  ALT 30  --  29  ALKPHOS 40  --  38  BILITOT 5.7* 5.9* 4.9*  PROT 5.4*  --  5.1*  ALBUMIN 2.5*  --  2.3*   Recent Labs  Lab 04/06/23 1407  LIPASE 27   Recent Labs  Lab 04/06/23 1406  AMMONIA 41*   Coagulation Profile: Recent Labs  Lab 04/06/23 1407  INR 1.3*   Cardiac Enzymes: No results for input(s): "CKTOTAL", "CKMB", "CKMBINDEX", "TROPONINI" in the last 168 hours. BNP (  last 3 results) No results for input(s): "PROBNP" in the last 8760 hours. HbA1C: No results for input(s): "HGBA1C" in the last 72 hours. CBG: No results for input(s): "GLUCAP" in the last 168 hours. Lipid Profile: No results for input(s): "CHOL", "HDL", "LDLCALC", "TRIG", "CHOLHDL", "LDLDIRECT" in the last 72 hours. Thyroid Function Tests: Recent Labs    04/06/23 1933  TSH 2.938   Anemia Panel: Recent Labs    04/06/23 1933 04/06/23 2035  VITAMINB12 1,152*  --   FOLATE  --  3.5*  FERRITIN  --  517*  TIBC  --  269  IRON  --  41*  RETICCTPCT 3.4*  --    Sepsis Labs: Recent Labs  Lab 04/06/23 1407  PROCALCITON <0.10  LATICACIDVEN 1.2    Recent Results (from the past 240 hour(s))  Blood culture (routine single)     Status: None (Preliminary result)   Collection Time: 04/06/23  2:07 PM   Specimen: BLOOD RIGHT ARM  Result Value Ref Range Status   Specimen Description BLOOD RIGHT ARM  Final    Special Requests   Final    BOTTLES DRAWN AEROBIC AND ANAEROBIC Blood Culture results may not be optimal due to an inadequate volume of blood received in culture bottles   Culture   Final    NO GROWTH < 24 HOURS Performed at [facility], [address], [place], [place] [address]    Report Status PENDING  Incomplete     Radiology Studies: CT ABDOMEN PELVIS W CONTRAST  Result Date: 04/06/2023 CLINICAL DATA:  Abdominal and flank pain. Stone suspected. Bilateral leg pain and swelling. EXAM: CT ABDOMEN AND PELVIS WITH CONTRAST TECHNIQUE: Multidetector CT imaging of the abdomen and pelvis was performed using the standard protocol following bolus administration of intravenous contrast. RADIATION DOSE REDUCTION: This exam was performed according to the departmental dose-optimization program which includes automated exposure control, adjustment of the mA and/or kV according to patient size and/or use of iterative reconstruction technique. CONTRAST:  OMNIPAQUE IOHEXOL 300 MG/ML  SOLN COMPARISON:  None Available. FINDINGS: Lower chest: There is dependent atelectasis with small to moderate bilateral pleural effusions. Trace pericardial effusion evident. Hepatobiliary: No suspicious focal abnormality within the liver parenchyma. Gallbladder is nondistended. No intrahepatic or extrahepatic biliary dilation. Pancreas: No focal mass lesion. No dilatation of the main duct. No intraparenchymal cyst. No peripancreatic edema. Spleen: No focal abnormality in the spleen. Adrenals/Urinary Tract: No adrenal nodule or mass. Kidneys unremarkable. No evidence for hydroureter. The urinary bladder appears normal for the degree of distention. Stomach/Bowel: Stomach is unremarkable. No gastric wall thickening. No evidence of outlet obstruction. Duodenum is normally positioned as is the ligament of Treitz. No small bowel wall thickening. No small bowel dilatation. The terminal ileum is normal. The appendix is  normal. No gross colonic mass. No colonic wall thickening. Vascular/Lymphatic: There is moderate atherosclerotic calcification of the abdominal aorta without aneurysm. There is no gastrohepatic or hepatoduodenal ligament lymphadenopathy. No retroperitoneal or mesenteric lymphadenopathy. No pelvic sidewall lymphadenopathy. Reproductive: The prostate gland and seminal vesicles are unremarkable. Other: Trace free fluid is seen in the pelvis. Musculoskeletal: Small bilateral groin hernias contain only fat. Body wall edema noted lower abdomen and pelvis. No worrisome lytic or sclerotic osseous abnormality. IMPRESSION: 1. No acute findings in the abdomen or pelvis. Specifically, no findings to explain the patient's history of abdominal pain. 2. Small to moderate bilateral pleural effusions with trace pericardial effusion. 3. Trace free fluid in the pelvis. This is technically abnormal  in a male patient although etiology for this fluid is not evident. 4. Body wall edema. 5. Small bilateral groin hernias contain only fat. 6.  Aortic Atherosclerosis (ICD10-I70.0). Electronically Signed   By: Kennith Center M.D.   On: 04/06/2023 15:39   US Abdomen Limited RUQ (LIVER/GB)  Result Date: 04/06/2023 CLINICAL DATA:  Abnormal liver function tests. EXAM: ULTRASOUND ABDOMEN LIMITED RIGHT UPPER QUADRANT COMPARISON:  October 09, 2021. FINDINGS: Gallbladder: No cholelithiasis is noted. Gallbladder appears to be mildly contracted. Mild amount of sludge is noted. No sonographic Murphy's sign. Common bile duct: Diameter: 4 mm which is within normal limits. Liver: No focal lesion identified. Increased echogenicity of hepatic parenchyma is noted. Portal vein is patent on color Doppler imaging with normal direction of blood flow towards the liver. Other: None. IMPRESSION: Mild amount of sludge seen within gallbladder lumen. Probable hepatic steatosis. Electronically Signed   By: Lupita Raider M.D.   On: 04/06/2023 14:02   DG Chest 2  View  Result Date: 04/06/2023 CLINICAL DATA:  Dyspnea EXAM: CHEST - 2 VIEW COMPARISON:  10/09/2021 FINDINGS: Transverse diameter of heart is increased. Central pulmonary vessels are prominent. Increased interstitial markings are seen in lower lung fields. There is blunting of both posterior costophrenic angles. There is no pneumothorax. IMPRESSION: Cardiomegaly. Increased interstitial markings are seen in lower lung fields suggesting interstitial edema or interstitial pneumonia. Small bilateral pleural effusions. Electronically Signed   By: Ernie Avena M.D.   On: 04/06/2023 13:18     Scheduled Meds:  sodium chloride   Intravenous Once   sodium chloride   Intravenous Once   sodium chloride   Intravenous Once   enoxaparin (LOVENOX) injection  40 mg Subcutaneous Q24H   folic acid  1 mg Oral Daily   furosemide  80 mg Intravenous BID   multivitamin with minerals  1 tablet Oral Daily   pantoprazole (PROTONIX) IV  40 mg Intravenous Q12H   potassium chloride  40 mEq Oral BID   sodium chloride flush  3 mL Intravenous Q12H   thiamine  100 mg Oral Daily   Or   thiamine  100 mg Intravenous Daily   Continuous Infusions:  sodium chloride     magnesium sulfate bolus IVPB     potassium chloride 10 mEq (04/07/23 0954)     LOS: 1 day    Time spent: 50 mins    Willeen Niece, MD [facility]   If 7PM-7AM, please contact night-coverage

## 2023-04-07 NOTE — Consult Note (Signed)
Cardiology Consult    Patient ID: Shawn Wolfe MRN: [id number], DOB/AGE: [birth year]   Admit date: 04/06/2023 Date of Consult: 04/07/2023  Primary Physician: Oneita Hurt, No Primary Cardiologist: None - new Requesting Provider: Lindi Adie, MD  Patient Profile    Shawn Wolfe is a 47 y.o. male with a history of ETOH abuse, HTN, thalassemia minor, iron deficiency anemia, B12 deficiency, abnl LFTs, and gout, who is being seen today for the evaluation of acute CHF at the request of Dr. Idelle Leech.  Past Medical History   Past Medical History:  Diagnosis Date   Abnormal LFTs    Alcohol abuse    B12 deficiency    Gout    History of echocardiogram    a. 09/2021 Echo: EF 60-65%, no rwma, nl RV fxn, triv MR.   Hypertension    Iron deficiency anemia    Thalassemia minor     Past Surgical History:  Procedure Laterality Date   HAND SURGERY Right    mutiple fractures requiring ORIF - pins later removed.     Allergies  Allergies  Allergen Reactions   Sulfa Antibiotics Hives   Latex Rash    History of Present Illness    47 y.o. male with a history of ETOH abuse, HTN, thalassemia minor, iron deficiency anemia, B12 deficiency, abnl LFTs, and gout.  He was previously admitted in 09/2021 w/ polyarticular idiopathic gout, anemia, and lower extremity edema, which was felt to be secondary to gout. Echo during admission showed nl LV fxn w/o significant valvular disease.  He required treatment w/ IV iron, B12 supplementation, and was referred to GI as outpt, but never followed-up.  Since then, he has continued to drink heavily, greater than 12 alcoholic beverages a day, and in the setting of unemployment over the past 4 months, he has not been taking any of his medications at least that long.  He lives locally in [place], [place] with his girlfriend.  He does not smoke cigarettes or use drugs, but does use chewing tobacco.  He was in his usual state of health until about 3 weeks ago, when he  noted dyspnea exertion while walking in a [employer].  Since then, he has cut back on his activity or has done things in smaller bursts in order to prevent recurrent dyspnea.  He has not been experiencing any chest pain.  Over the past week, he started noticing increasing edema of his legs and hips, along with increased abdominal girth, and early satiety.  Due to progressive dyspnea and edema, he presented to the emergency department on April 12.  Here, labs notable for normocytic anemia w/ H/H of 5.8/19.4, Fe 41, folate 3.5, K 2.1, Cl 87, BNP 599.6, nl HsTrop, INR 1.3.  ECG unremarkable.  CXR w/ cardiomegaly and interstitial edema.  CT abd/pelvis w/ ascites and small to mod bilateral pleural effusions.  Ao atherosclerosis also noted.  He was treated with intravenous Lasix and also received a unit of blood yesterday and is due for another unit this morning.  Blood pressures have improved significantly since admission and he is feeling better at rest though remains markedly volume overloaded.  Inpatient Medications     sodium chloride   Intravenous Once   sodium chloride   Intravenous Once   sodium chloride   Intravenous Once   enoxaparin (LOVENOX) injection  40 mg Subcutaneous Q24H   folic acid  1 mg Oral Daily   furosemide  80 mg Intravenous BID   multivitamin with minerals  1 tablet Oral Daily   pantoprazole (PROTONIX) IV  40 mg Intravenous Q12H   potassium chloride  40 mEq Oral BID   sodium chloride flush  3 mL Intravenous Q12H   thiamine  100 mg Oral Daily   Or   thiamine  100 mg Intravenous Daily    Family History    Family History  Problem Relation Age of Onset   Hypertension Mother    Liver disease Mother    Cancer Father    Cancer Sister    Hypertension Brother    He indicated that his mother is deceased. He indicated that his father is deceased. He indicated that the status of his sister is unknown. He indicated that the status of his brother is unknown.   Social History     Social History   Socioeconomic History   Marital status: Single    Spouse name: Not on file   Number of children: Not on file   Years of education: Not on file   Highest education level: Not on file  Occupational History   Not on file  Tobacco Use   Smoking status: Never   Smokeless tobacco: Current    Types: Chew  Vaping Use   Vaping Use: Never used  Substance and Sexual Activity   Alcohol use: Yes    Alcohol/week: 84.0 standard drinks of alcohol    Types: 84 Cans of beer per week    Comment: at least 12 cans of mixed cocktail/malt beverage daily.   Drug use: Never   Sexual activity: Yes  Other Topics Concern   Not on file  Social History Narrative   Lives in [place], [place] w/ girlfriend.  Does not routinely exercise.     Social Determinants of Health   Financial Resource Strain: Not on file  Food Insecurity: Not on file  Transportation Needs: Not on file  Physical Activity: Not on file  Stress: Not on file  Social Connections: Not on file  Intimate Partner Violence: Not on file     Review of Systems    General:  No chills, fever, night sweats or weight changes.  Cardiovascular:  No chest pain, +++ dyspnea on exertion,+++ gut and LE edema, no orthopnea, palpitations, paroxysmal nocturnal dyspnea, +++ early satiety. Dermatological: No rash, lesions/masses Respiratory: No cough, +++ dyspnea Urologic: No hematuria, dysuria Abdominal:  +++ inc abd girth.  No nausea, vomiting, diarrhea, bright red blood per rectum, melena, or hematemesis Neurologic:  No visual changes, wkns, changes in mental status. All other systems reviewed and are otherwise negative except as noted above.  Physical Exam    Blood pressure 122/81, pulse 84, temperature 98.3 F (36.8 C), temperature source Oral, resp. rate 18, height  (1.676 m), weight 83.9 kg, SpO2 98 %.  General: Pleasant, NAD Psych: Normal affect. Neuro: Alert and oriented X 3. Moves all extremities spontaneously. HEENT:  Normal  Neck: Supple without bruits.  JVD to earlobe. Lungs:  Resp regular and unlabored, bibasilar crackles. Heart: RRR.  + S4.  2/6 syst murmur @ apex. Abdomen: Firm, edematous - 1+ flank/abd wall edema, diffusely/mildly tender, BS + x 4.  Extremities: No clubbing, cyanosis.  3+ bilat LEE to knees, 2+ edema of thighs/hips.  DP/PT2+, Radials 2+ and equal bilaterally.  Labs    Cardiac Enzymes Recent Labs  Lab 04/06/23 1933  TROPONINIHS 12     BNP    Component Value Date/Time   BNP 599.6 (H) 04/06/2023 1407   Lab Results  Component  Value Date   WBC 5.0 04/07/2023   HGB 6.1 (L) 04/07/2023   HCT 20.1 (L) 04/07/2023   MCV 87.8 04/07/2023   PLT 124 (L) 04/07/2023    Recent Labs  Lab 04/07/23 0434  NA 139  K 2.8*  CL 95*  CO2 35*  BUN <5*  CREATININE 0.57*  CALCIUM 6.8*  PROT 5.1*  BILITOT 4.9*  ALKPHOS 38  ALT 29  AST 49*  GLUCOSE 100*     Radiology Studies    CT ABDOMEN PELVIS W CONTRAST  Result Date: 04/06/2023 CLINICAL DATA:  Abdominal and flank pain. Stone suspected. Bilateral leg pain and swelling. EXAM: CT ABDOMEN AND PELVIS WITH CONTRAST TECHNIQUE: Multidetector CT imaging of the abdomen and pelvis was performed using the standard protocol following bolus administration of intravenous contrast. RADIATION DOSE REDUCTION: This exam was performed according to the departmental dose-optimization program which includes automated exposure control, adjustment of the mA and/or kV according to patient size and/or use of iterative reconstruction technique. CONTRAST:  OMNIPAQUE IOHEXOL 300 MG/ML  SOLN COMPARISON:  None Available. FINDINGS: Lower chest: There is dependent atelectasis with small to moderate bilateral pleural effusions. Trace pericardial effusion evident. Hepatobiliary: No suspicious focal abnormality within the liver parenchyma. Gallbladder is nondistended. No intrahepatic or extrahepatic biliary dilation. Pancreas: No focal mass lesion. No dilatation of  the main duct. No intraparenchymal cyst. No peripancreatic edema. Spleen: No focal abnormality in the spleen. Adrenals/Urinary Tract: No adrenal nodule or mass. Kidneys unremarkable. No evidence for hydroureter. The urinary bladder appears normal for the degree of distention. Stomach/Bowel: Stomach is unremarkable. No gastric wall thickening. No evidence of outlet obstruction. Duodenum is normally positioned as is the ligament of Treitz. No small bowel wall thickening. No small bowel dilatation. The terminal ileum is normal. The appendix is normal. No gross colonic mass. No colonic wall thickening. Vascular/Lymphatic: There is moderate atherosclerotic calcification of the abdominal aorta without aneurysm. There is no gastrohepatic or hepatoduodenal ligament lymphadenopathy. No retroperitoneal or mesenteric lymphadenopathy. No pelvic sidewall lymphadenopathy. Reproductive: The prostate gland and seminal vesicles are unremarkable. Other: Trace free fluid is seen in the pelvis. Musculoskeletal: Small bilateral groin hernias contain only fat. Body wall edema noted lower abdomen and pelvis. No worrisome lytic or sclerotic osseous abnormality. IMPRESSION: 1. No acute findings in the abdomen or pelvis. Specifically, no findings to explain the patient's history of abdominal pain. 2. Small to moderate bilateral pleural effusions with trace pericardial effusion. 3. Trace free fluid in the pelvis. This is technically abnormal in a male patient although etiology for this fluid is not evident. 4. Body wall edema. 5. Small bilateral groin hernias contain only fat. 6.  Aortic Atherosclerosis (ICD10-I70.0). Electronically Signed   By: Kennith Center M.D.   On: 04/06/2023 15:39   US Abdomen Limited RUQ (LIVER/GB)  Result Date: 04/06/2023 CLINICAL DATA:  Abnormal liver function tests. EXAM: ULTRASOUND ABDOMEN LIMITED RIGHT UPPER QUADRANT COMPARISON:  October 09, 2021. FINDINGS: Gallbladder: No cholelithiasis is noted. Gallbladder  appears to be mildly contracted. Mild amount of sludge is noted. No sonographic Murphy's sign. Common bile duct: Diameter: 4 mm which is within normal limits. Liver: No focal lesion identified. Increased echogenicity of hepatic parenchyma is noted. Portal vein is patent on color Doppler imaging with normal direction of blood flow towards the liver. Other: None. IMPRESSION: Mild amount of sludge seen within gallbladder lumen. Probable hepatic steatosis. Electronically Signed   By: Lupita Raider M.D.   On: 04/06/2023 14:02   DG Chest  2 View  Result Date: 04/06/2023 CLINICAL DATA:  Dyspnea EXAM: CHEST - 2 VIEW COMPARISON:  10/09/2021 FINDINGS: Transverse diameter of heart is increased. Central pulmonary vessels are prominent. Increased interstitial markings are seen in lower lung fields. There is blunting of both posterior costophrenic angles. There is no pneumothorax. IMPRESSION: Cardiomegaly. Increased interstitial markings are seen in lower lung fields suggesting interstitial edema or interstitial pneumonia. Small bilateral pleural effusions. Electronically Signed   By: Ernie Avena M.D.   On: 04/06/2023 13:18    ECG & Cardiac Imaging    RSR, 88, nonspecific inflat ST/T abnormalities - personally reviewed.  Assessment & Plan    1.  Acute CHF:  Prior echo w/ nl EF in 09/2021.  He is a heavy drinker, at least 12 cocktail beverages daily x >10 years.  He has been off of meds for > 4-6 months.  Presented 4/12 w/ a 3 wk h/o DOE and progressive edema impacting lower ext, hips, and abdomen with subsequent development of early satiety. In ED labs notable for normocytic anemia w/ H/H of 5.8/19.4, Fe 41, folate 3.5, K 2.1, Cl 87, BNP 599.6, nl HsTrop, INR 1.3.  ECG unremarkable.  CXR w/ cardiomegaly and interstitial edema.  CT abd/pelvis w/ ascites and small to mod bilateral pleural effusions.  Ao atherosclerosis also noted.  He has received a unit of blood as well as intravenous Lasix and denies dyspnea  at rest.  He remains markedly volume overloaded with JVD to his earlobe, and anasarca.  He is due for another unit of blood this morning.  Echo pending.  I will increase his Lasix to 80 mg IV twice daily and would have a low threshold to switch him over to an infusion.  Suspect we will find LV dysfunction on echocardiogram and in that setting, would likely look toward right and left heart cardiac catheterization early next week provided volume status and anemia allow.  Will add low-dose losartan and SGLT2 inhibitor for now.  Pending echo, we will look toward adding beta-blocker and switching to [facility] if appropriate.  Potassium and magnesium supplementation ordered.  2.  Normocytic anemia/thalassemia minor: Patient with prior history of iron deficiency and B12 deficiency.  He was advised to follow-up with GI in October 2022 but did not.  He is unaware of any bleeding, dark stools, or bright red blood in stools.  He describes heavy alcohol use.  He is due for another unit of packed red blood cells today (received 1 unit last night).  Further workup per internal medicine.  Anemia very likely playing a role in anasarca.  3.  Essential hypertension: Currently stable.  Follow with addition of GDMT.  4.  Alcohol abuse: Drinking at least 12 cans of mixed cocktails/malt beverages daily.  CIWA protocol per medicine team.  Will need aggressive counseling and outpatient follow-up.  5.  Hypokalemia/hypomagnesemia: Supplementation ordered.  Follow with diuresis.  Risk Assessment/Risk Scores:        New York Heart Association (NYHA) Functional Class NYHA Class IV    Signed, Nicolasa Ducking, NP 04/07/2023, 10:59 AM  For questions or updates, please contact   Please consult [email or web address] for contact info under Cardiology/STEMI.

## 2023-04-07 NOTE — Progress Notes (Signed)
  Echocardiogram 2D Echocardiogram has not been performed. Unable to complete echo at this time, the patient was given lasix and is frequently using the urinal. Echo will be attempted later or tomorrow when patient is not receiving lasix.  Lenor Coffin 04/07/2023, 11:08 AM

## 2023-04-08 ENCOUNTER — Inpatient Hospital Stay (HOSPITAL_COMMUNITY)
Admit: 2023-04-08 | Discharge: 2023-04-08 | Disposition: A | Discharge status: Home / Self Care | Payer: Medicaid Other | Attending: Family Medicine | Admitting: Family Medicine

## 2023-04-08 DIAGNOSIS — D649 Anemia, unspecified: Secondary | ICD-10-CM | POA: Diagnosis not present

## 2023-04-08 DIAGNOSIS — I5021 Acute systolic (congestive) heart failure: Secondary | ICD-10-CM | POA: Diagnosis not present

## 2023-04-08 DIAGNOSIS — E876 Hypokalemia: Secondary | ICD-10-CM | POA: Diagnosis not present

## 2023-04-08 DIAGNOSIS — J9601 Acute respiratory failure with hypoxia: Secondary | ICD-10-CM | POA: Diagnosis not present

## 2023-04-08 DIAGNOSIS — I517 Cardiomegaly: Secondary | ICD-10-CM | POA: Diagnosis not present

## 2023-04-08 DIAGNOSIS — R7401 Elevation of levels of liver transaminase levels: Secondary | ICD-10-CM | POA: Diagnosis not present

## 2023-04-08 DIAGNOSIS — R601 Generalized edema: Secondary | ICD-10-CM | POA: Diagnosis not present

## 2023-04-08 LAB — BPAM RBC
Blood Product Expiration Date: 202405112359
Blood Product Expiration Date: 202405162359
ISSUE DATE / TIME: 202404130832

## 2023-04-08 LAB — CBC
HCT: 26.2 % — ABNORMAL LOW (ref 39.0–52.0)
Hemoglobin: 8 g/dL — ABNORMAL LOW (ref 13.0–17.0)
MCH: 27 pg (ref 26.0–34.0)
MCHC: 30.5 g/dL (ref 30.0–36.0)
MCV: 88.5 fL (ref 80.0–100.0)
Platelets: 130 10*3/uL — ABNORMAL LOW (ref 150–400)
RBC: 2.96 MIL/uL — ABNORMAL LOW (ref 4.22–5.81)
RDW: 21.1 % — ABNORMAL HIGH (ref 11.5–15.5)
WBC: 5.3 10*3/uL (ref 4.0–10.5)
nRBC: 1.1 % — ABNORMAL HIGH (ref 0.0–0.2)

## 2023-04-08 LAB — TYPE AND SCREEN
ABO/RH(D): O POS
Unit division: 0

## 2023-04-08 LAB — BASIC METABOLIC PANEL
Anion gap: 10 (ref 5–15)
BUN: 6 mg/dL (ref 6–20)
CO2: 31 mmol/L (ref 22–32)
Calcium: 7.7 mg/dL — ABNORMAL LOW (ref 8.9–10.3)
Chloride: 93 mmol/L — ABNORMAL LOW (ref 98–111)
Creatinine, Ser: 0.66 mg/dL (ref 0.61–1.24)
GFR, Estimated: >60 mL/min (ref >60)
Glucose, Bld: 79 mg/dL (ref 70–99)
Potassium: 3.3 mmol/L — ABNORMAL LOW (ref 3.5–5.1)
Sodium: 134 mmol/L — ABNORMAL LOW (ref 135–145)

## 2023-04-08 LAB — ECHOCARDIOGRAM COMPLETE
AR max vel: 2.2 cm2
AV Area VTI: 2.41 cm2
AV Area mean vel: 2.21 cm2
AV Mean grad: 6 mmHg
AV Peak grad: 12.3 mmHg
Ao pk vel: 1.75 m/s
Area-P 1/2: 3.95 cm2
S' Lateral: 2.8 cm

## 2023-04-08 LAB — OCCULT BLOOD X 1 CARD TO LAB, STOOL: Fecal Occult Bld: NEGATIVE

## 2023-04-08 LAB — PHOSPHORUS: Phosphorus: 2.9 mg/dL (ref 2.5–4.6)

## 2023-04-08 LAB — MAGNESIUM: Magnesium: 1.7 mg/dL (ref 1.7–2.4)

## 2023-04-08 MED ORDER — POTASSIUM CHLORIDE 20 MEQ PO PACK
40.0000 meq | PACK | Freq: Once | ORAL | Status: AC
  Administered 2023-04-08: 40 meq via ORAL
  Filled 2023-04-08: qty 2

## 2023-04-08 MED ORDER — OXYCODONE HCL 5 MG PO TABS
5.0000 mg | ORAL_TABLET | Freq: Four times a day (QID) | ORAL | Status: DC | PRN
  Administered 2023-04-08 – 2023-04-09 (×4): 5 mg via ORAL
  Filled 2023-04-08 (×5): qty 1

## 2023-04-08 MED ORDER — OXYCODONE HCL 5 MG PO TABS
5.0000 mg | ORAL_TABLET | Freq: Once | ORAL | Status: AC
  Administered 2023-04-08: 5 mg via ORAL
  Filled 2023-04-08: qty 1

## 2023-04-08 NOTE — Progress Notes (Signed)
PROGRESS NOTE    Shawn Wolfe  ZOX:[id number] DOB: Aug 25, 1976 DOA: 04/06/2023 PCP: Pcp, No   Brief Narrative: This 47 y.o. male with PMH  significant of heavy alcohol abuse, gout, hypertension, thalassemia minor, presented in the ED with anasarca, acute hypoxic respiratory failure, hypokalemia, hyperbilirubinemia, acute on chronic anemia.  Patient reports progressive shortness of breath over the past 1 to 2 weeks.  He reports abdominal and lower extremity swelling associated with orthopnea and PND.  Patient has not been able to lay flat for multiple weeks.  He has been drinking at least 12+ concentrated alcohol beers daily as well as mixed drinks and some liquor.  He has been noncompliant with his allopurinol.  Patient reports taking high-dose NSAIDs for pain relief.  Significant labs in the ED hemoglobin 5.8, potassium 2.1, calcium 6.9, BNP 600, chest x-ray shows cardiomegaly with increased institutional markings concerning for edema versus pneumonia.  Abdominal ultrasound gallbladder wall sludge and.  CT abdomen and pelvis shows small to moderate bilateral pleural effusion and trace pericardial effusion.  Patient is admitted for further evaluation.  Assessment & Plan:   Principal Problem:   Anasarca Active Problems:   Hypokalemia   Hyperbilirubinemia   Alcohol abuse   Thalassemia minor   Acute respiratory failure with hypoxia   Alcoholic liver disease   Cardiomegaly   Transaminitis   Anemia   Gout  Anasarca: Patient appears diffusely edematous from abdomen to lower extremities with progressive orthopnea, PND, DOE Patient reports heavy EtOH use (12+ alcohol concentrated beers daily + mixed drinks)  Heavy NSAID use in the setting of baseline gout. BNP 600 with cardiomegaly on chest x-ray. CT A/P  with small to moderate bilateral pleural effusions No ascites or cirrhosis on imaging in the setting of longstanding alcohol abuse. 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 Continue Lasix 80 mg IV every 12 hours Obtain 2D echocardiogram. UDS unremarkable. 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 Continue diuresis and determine if he needs a cath after his echocardiogram.  Continued Farxiga and losartan.   Hyperbilirubinemia: T. bili 5.7 on presentation with noted jaundice. Abdominal ultrasound and CT of the abdomen pelvis grossly negative for any acute obstruction or gallbladder distention. Differential diagnosis fairly broad including hepatic congestion in setting of CHF versus liver disease in the setting of alcohol use LFTs relatively stable apart from AST 57  Hepatitis panel > Non reactive. Consider GI consult is indicated.   Hypokalemia: Replaced.  Continue to monitor.   Acute hypoxic respiratory failure: Patient presented with progressively worsening shortness of breath, increased work of breathing. Suspect secondary to anasarca . Chest x-ray shows bilateral moderate pleural effusion and trace pericardial effusion. Continue IV diuresis. 2D echo pending No overt infectious process of lungs noted. He is successfully weaned down to room air.  Thalassemia minor: Hgb 5.8  on arrival with baseline hgb around 8-10 . Pt denies any hematesis or bloody stools.  Obtain a stool for occult bleeding Peripheral smear given hyperbilirubinemia  Hemoglobin 8.0 after 2 unit PRBC.   Alcohol abuse: Patient reports heavy alcohol intake. Alcohol level less than 10 Patient is considering detox Continue CIWA protocol.  Urine drug screen pending Continue IV PPI.   Alcoholic liver disease: Abdominal ultrasound with probable hepatic steatosis in the setting of heavy alcohol use. No overt cirrhosis, portal hypertension or ascites. Noted jaundice T. bili 5.7, INR 1.3, platelets 152 MELD score 16   Gout: Active polyarticular gout flare involving  bilateral knees and left great toe. Patient not  taking allopurinol on a consistent basis. Colchicine for acute treatment Defer NSAIDs and Solu-Medrol for now given anasarca     DVT prophylaxis:  Lovenox Code Status: Full code Family Communication: No family at bed side. Disposition Plan:     Status is: Inpatient Remains inpatient appropriate because: Admitted for abdominal distention,  alcohol abuse and generalized anasarca.  Cardiology is consulted.   Consultants:  Cardiology  Procedures: US Abdomen, CT Abdomen and Pelvis Antimicrobials:  Anti-infectives (From admission, onward)    None      Subjective: Patient was seen and examined at bedside.  Overnight events noted. Patient reports feeling much improved.  He is weaned down to room air. Patient denies any chest pain,  states he is feeling little lighter.  Objective: Vitals:   04/07/23 2000 04/07/23 2313 04/08/23 0434 04/08/23 0805  BP: 123/84 117/74 124/83 115/76  Pulse: 78 75 68 77  Resp: Temp: 98.5 F (36.9 C) 98.2 F (36.8 C) 98.1 F (36.7 C) 98.6 F (37 C)  TempSrc:    Oral  SpO2: 96% 93% 100% 97%  Weight:      Height:        Intake/Output Summary (Last 24 hours) at 04/08/2023 1029 Last data filed at 04/08/2023 1015 Gross per 24 hour  Intake 1070 ml  Output 5885 ml  Net -4815 ml   Filed Weights   04/06/23 1244  Weight: 83.9 kg    Examination:  General exam: Appears comfortable, deconditioned, not in any distress. Respiratory system: CTA bilaterally, respiratory effort normal, RR 15 Cardiovascular system: S1 & S2 heard, regular rate and rhythm, no murmur. Gastrointestinal system: Abdomen is soft, distended, non tender, BS+ Central nervous system: Alert and oriented x 3. No focal neurological deficits. Extremities: Edema+, no cyanosis, no clubbing  Skin: No rashes, lesions or ulcers Psychiatry: Judgement and insight appear normal. Mood & affect appropriate.     Data Reviewed: I have personally reviewed following labs and  imaging studies  CBC: Recent Labs  Lab 04/06/23 1407 04/07/23 0434 04/08/23 0515  WBC 5.4 5.0 5.3  NEUTROABS 4.3  --   --   HGB 5.8* 6.1* 8.0*  HCT 19.4* 20.1* 26.2*  MCV 87.4 87.8 88.5  PLT 152 124* 130*   Basic Metabolic Panel: Recent Labs  Lab 04/06/23 1407 04/06/23 1933 04/06/23 2035 04/07/23 0431 04/07/23 0434 04/07/23 1239 04/08/23 0515  NA 137  --  137  --  139  --  134*  K 2.1*  --  2.2*  --  2.8* 3.2* 3.3*  CL 87*  --  87*  --  95*  --  93*  CO2 36*  --  37*  --  35*  --  31  GLUCOSE 117*  --  114*  --  100*  --  79  BUN <5*  --  <5*  --  <5*  --  6  CREATININE 0.52*  --  0.69  --  0.57*  --  0.66  CALCIUM 6.9*  --  6.9*  --  6.8*  --  7.7*  MG  --  1.5*  --  1.6*  --   --  1.7  PHOS  --   --   --   --   --   --  2.9   GFR: Estimated Creatinine Clearance: 114.7 mL/min (by C-G formula based on SCr of 0.66 mg/dL). Liver Function Tests: Recent Labs  Lab  04/06/23 1407 04/06/23 1933 04/07/23 0434  AST 57*  --  49*  ALT 30  --  29  ALKPHOS 40  --  38  BILITOT 5.7* 5.9* 4.9*  PROT 5.4*  --  5.1*  ALBUMIN 2.5*  --  2.3*   Recent Labs  Lab 04/06/23 1407  LIPASE 27   Recent Labs  Lab 04/06/23 1406  AMMONIA 41*   Coagulation Profile: Recent Labs  Lab 04/06/23 1407  INR 1.3*   Cardiac Enzymes: No results for input(s): "CKTOTAL", "CKMB", "CKMBINDEX", "TROPONINI" in the last 168 hours. BNP (last 3 results) No results for input(s): "PROBNP" in the last 8760 hours. HbA1C: No results for input(s): "HGBA1C" in the last 72 hours. CBG: No results for input(s): "GLUCAP" in the last 168 hours. Lipid Profile: No results for input(s): "CHOL", "HDL", "LDLCALC", "TRIG", "CHOLHDL", "LDLDIRECT" in the last 72 hours. Thyroid Function Tests: Recent Labs    04/06/23 1933  TSH 2.938   Anemia Panel: Recent Labs    04/06/23 1933 04/06/23 2035  VITAMINB12 1,152*  --   FOLATE  --  3.5*  FERRITIN  --  517*  TIBC  --  269  IRON  --  41*  RETICCTPCT 3.4*   --    Sepsis Labs: Recent Labs  Lab 04/06/23 1407  PROCALCITON <0.10  LATICACIDVEN 1.2    Recent Results (from the past 240 hour(s))  Blood culture (routine single)     Status: None (Preliminary result)   Collection Time: 04/06/23  2:07 PM   Specimen: BLOOD RIGHT ARM  Result Value Ref Range Status   Specimen Description BLOOD RIGHT ARM  Final   Special Requests   Final    BOTTLES DRAWN AEROBIC AND ANAEROBIC Blood Culture results may not be optimal due to an inadequate volume of blood received in culture bottles   Culture   Final    NO GROWTH 2 DAYS Performed at [facility], [address], [place], [place] [address]    Report Status PENDING  Incomplete     Radiology Studies: CT ABDOMEN PELVIS W CONTRAST  Result Date: 04/06/2023 CLINICAL DATA:  Abdominal and flank pain. Stone suspected. Bilateral leg pain and swelling. EXAM: CT ABDOMEN AND PELVIS WITH CONTRAST TECHNIQUE: Multidetector CT imaging of the abdomen and pelvis was performed using the standard protocol following bolus administration of intravenous contrast. RADIATION DOSE REDUCTION: This exam was performed according to the departmental dose-optimization program which includes automated exposure control, adjustment of the mA and/or kV according to patient size and/or use of iterative reconstruction technique. CONTRAST:  OMNIPAQUE IOHEXOL 300 MG/ML  SOLN COMPARISON:  None Available. FINDINGS: Lower chest: There is dependent atelectasis with small to moderate bilateral pleural effusions. Trace pericardial effusion evident. Hepatobiliary: No suspicious focal abnormality within the liver parenchyma. Gallbladder is nondistended. No intrahepatic or extrahepatic biliary dilation. Pancreas: No focal mass lesion. No dilatation of the main duct. No intraparenchymal cyst. No peripancreatic edema. Spleen: No focal abnormality in the spleen. Adrenals/Urinary Tract: No adrenal nodule or mass. Kidneys unremarkable. No evidence  for hydroureter. The urinary bladder appears normal for the degree of distention. Stomach/Bowel: Stomach is unremarkable. No gastric wall thickening. No evidence of outlet obstruction. Duodenum is normally positioned as is the ligament of Treitz. No small bowel wall thickening. No small bowel dilatation. The terminal ileum is normal. The appendix is normal. No gross colonic mass. No colonic wall thickening. Vascular/Lymphatic: There is moderate atherosclerotic calcification of the abdominal aorta without aneurysm. There is no  gastrohepatic or hepatoduodenal ligament lymphadenopathy. No retroperitoneal or mesenteric lymphadenopathy. No pelvic sidewall lymphadenopathy. Reproductive: The prostate gland and seminal vesicles are unremarkable. Other: Trace free fluid is seen in the pelvis. Musculoskeletal: Small bilateral groin hernias contain only fat. Body wall edema noted lower abdomen and pelvis. No worrisome lytic or sclerotic osseous abnormality. IMPRESSION: 1. No acute findings in the abdomen or pelvis. Specifically, no findings to explain the patient's history of abdominal pain. 2. Small to moderate bilateral pleural effusions with trace pericardial effusion. 3. Trace free fluid in the pelvis. This is technically abnormal in a male patient although etiology for this fluid is not evident. 4. Body wall edema. 5. Small bilateral groin hernias contain only fat. 6.  Aortic Atherosclerosis (ICD10-I70.0). Electronically Signed   By: Kennith Center M.D.   On: 04/06/2023 15:39   US Abdomen Limited RUQ (LIVER/GB)  Result Date: 04/06/2023 CLINICAL DATA:  Abnormal liver function tests. EXAM: ULTRASOUND ABDOMEN LIMITED RIGHT UPPER QUADRANT COMPARISON:  October 09, 2021. FINDINGS: Gallbladder: No cholelithiasis is noted. Gallbladder appears to be mildly contracted. Mild amount of sludge is noted. No sonographic Murphy's sign. Common bile duct: Diameter: 4 mm which is within normal limits. Liver: No focal lesion identified.  Increased echogenicity of hepatic parenchyma is noted. Portal vein is patent on color Doppler imaging with normal direction of blood flow towards the liver. Other: None. IMPRESSION: Mild amount of sludge seen within gallbladder lumen. Probable hepatic steatosis. Electronically Signed   By: Lupita Raider M.D.   On: 04/06/2023 14:02   DG Chest 2 View  Result Date: 04/06/2023 CLINICAL DATA:  Dyspnea EXAM: CHEST - 2 VIEW COMPARISON:  10/09/2021 FINDINGS: Transverse diameter of heart is increased. Central pulmonary vessels are prominent. Increased interstitial markings are seen in lower lung fields. There is blunting of both posterior costophrenic angles. There is no pneumothorax. IMPRESSION: Cardiomegaly. Increased interstitial markings are seen in lower lung fields suggesting interstitial edema or interstitial pneumonia. Small bilateral pleural effusions. Electronically Signed   By: Ernie Avena M.D.   On: 04/06/2023 13:18     Scheduled Meds:  sodium chloride   Intravenous Once   sodium chloride   Intravenous Once   sodium chloride   Intravenous Once   dapagliflozin propanediol  10 mg Oral Daily   enoxaparin (LOVENOX) injection  40 mg Subcutaneous Q24H   folic acid  1 mg Oral Daily   furosemide  80 mg Intravenous BID   losartan  12.5 mg Oral Daily   multivitamin with minerals  1 tablet Oral Daily   pantoprazole (PROTONIX) IV  40 mg Intravenous Q12H   potassium chloride  40 mEq Oral BID   sodium chloride flush  3 mL Intravenous Q12H   thiamine  100 mg Oral Daily   Or   thiamine  100 mg Intravenous Daily   Continuous Infusions:  sodium chloride       LOS: 2 days    Time spent: 35 mins    Willeen Niece, MD [facility]   If 7PM-7AM, please contact night-coverage

## 2023-04-08 NOTE — Discharge Instructions (Signed)
                  Intensive Outpatient Programs  [facility]    [facility] [address]     [address] [place],  [place]     [place], [place] [phone]      [phone]  [facility]   [facility]  (Inpatient and outpatient)  [phone] (Suboxone and Methadone) [address]           [phone]           [facility]    [facility] - Intensive Outpatient [address]     [address] [place], [place] [address]     [place], [place]  [phone]      [phone]  [facility] (Outpatient, Inpatient, [facility]  [facility] (Groups and Residental) (insurance only) [phone]    [place], [place]          [phone]       [facility]    [facility] (for caregivers and family) [address]     [address] [place], [place]     [place], [place] [phone]      [phone]  Residential Treatment Programs  [facility]  [facility](2 years) Residential: 90 days)  [facility] [address]      [address] [place], [place]     [place], [place] [phone]      [phone] or [phone]  [facility]    [facility] [address]      [address] [place], [place]     [place], [place] [phone]      [phone]  [facility]   [facility] ([facility] [address]     [address] [place], [place] [address]     [place], [place] [phone]      [phone] Admissions: 8am-3pm M-F  [facility]: Residential Program (90 Days)              [facility]: [facility]  [place], [place]     [place], [place]  [phone] or [phone]    (Walk in Hours over the weekend or by referral)   [place]: [facility]:[phone] (for crisis  response 24 hours a day) 

## 2023-04-08 NOTE — Progress Notes (Signed)
  Echocardiogram 2D Echocardiogram has been performed.  Shawn Wolfe 04/08/2023, 11:27 AM

## 2023-04-08 NOTE — Progress Notes (Signed)
Rounding Note    Patient Name: Shawn Wolfe Date of Encounter: 04/08/2023  [facility] Cardiologist: None   Subjective   Feeling better.  Breathing and swelling are both improving.  Inpatient Medications    Scheduled Meds:  sodium chloride   Intravenous Once   sodium chloride   Intravenous Once   sodium chloride   Intravenous Once   dapagliflozin propanediol  10 mg Oral Daily   enoxaparin (LOVENOX) injection  40 mg Subcutaneous Q24H   folic acid  1 mg Oral Daily   furosemide  80 mg Intravenous BID   losartan  12.5 mg Oral Daily   multivitamin with minerals  1 tablet Oral Daily   pantoprazole (PROTONIX) IV  40 mg Intravenous Q12H   potassium chloride  40 mEq Oral BID   sodium chloride flush  3 mL Intravenous Q12H   thiamine  100 mg Oral Daily   Or   thiamine  100 mg Intravenous Daily   Continuous Infusions:  sodium chloride     PRN Meds: sodium chloride, hydrALAZINE, LORazepam **OR** LORazepam, ondansetron **OR** ondansetron (ZOFRAN) IV, sodium chloride flush   Vital Signs    Vitals:   04/07/23 2000 04/07/23 2313 04/08/23 0434 04/08/23 0805  BP: 123/84 117/74 124/83 115/76  Pulse: 78 75 68 77  Resp: 18 19 18 18   Temp: 98.5 F (36.9 C) 98.2 F (36.8 C) 98.1 F (36.7 C) 98.6 F (37 C)  TempSrc:    Oral  SpO2: 96% 93% 100% 97%  Weight:      Height:        Intake/Output Summary (Last 24 hours) at 04/08/2023 1039 Last data filed at 04/08/2023 1015 Gross per 24 hour  Intake 1070 ml  Output 5885 ml  Net -4815 ml      04/06/2023   12:44 PM 02/26/2023    1:10 PM 10/10/2021    3:25 AM  Last 3 Weights  Weight (lbs) 185 lb 179 lb 10.8 oz 179 lb 10.8 oz  Weight (kg) 83.915 kg 81.5 kg 81.5 kg      Telemetry    Sinus rhythm, sinus tachycardia- Personally Reviewed  ECG    N/a - Personally Reviewed  Physical Exam   VS:  BP 115/76 (BP Location: Right Arm)   Pulse 77   Temp 98.6 F (37 C) (Oral)   Resp 18   Ht 5\' 6"  (1.676 m)   Wt 83.9  kg   SpO2 97%   BMI 29.86 kg/m  , BMI Body mass index is 29.86 kg/m. GENERAL:  Well appearing HEENT: Pupils equal round and reactive, fundi not visualized, oral mucosa unremarkable NECK:  + jugular venous distention, waveform within normal limits, carotid upstroke brisk and symmetric, no bruits, no thyromegaly LUNGS:  Clear to auscultation bilaterally HEART:  RRR.  PMI not displaced or sustained,S1 and S2 within normal limits, no S3, no S4, no clicks, no rubs, no murmurs ABD:  Flat, positive bowel sounds normal in frequency in pitch, no bruits, no rebound, no guarding, no midline pulsatile mass, no hepatomegaly, no splenomegaly EXT:  2 plus pulses throughout, 2+ lower extremity edema to mid tibia bilaterally, no cyanosis no clubbing SKIN:  No rashes no nodules NEURO:  Cranial nerves II through XII grossly intact, motor grossly intact throughout PSYCH:  Cognitively intact, oriented to person place and time  Labs    High Sensitivity Troponin:   Recent Labs  Lab 04/06/23 1933  TROPONINIHS 12     Chemistry Recent Labs  Lab 04/06/23 1407 04/06/23 1933 04/06/23 2035 04/07/23 0431 04/07/23 0434 04/07/23 1239 04/08/23 0515  NA 137  --  137  --  139  --  134*  K 2.1*  --  2.2*  --  2.8* 3.2* 3.3*  CL 87*  --  87*  --  95*  --  93*  CO2 36*  --  37*  --  35*  --  31  GLUCOSE 117*  --  114*  --  100*  --  79  BUN <5*  --  <5*  --  <5*  --  6  CREATININE 0.52*  --  0.69  --  0.57*  --  0.66  CALCIUM 6.9*  --  6.9*  --  6.8*  --  7.7*  MG  --  1.5*  --  1.6*  --   --  1.7  PROT 5.4*  --   --   --  5.1*  --   --   ALBUMIN 2.5*  --   --   --  2.3*  --   --   AST 57*  --   --   --  49*  --   --   ALT 30  --   --   --  29  --   --   ALKPHOS 40  --   --   --  38  --   --   BILITOT 5.7* 5.9*  --   --  4.9*  --   --   GFRNONAA >60  --  >60  --  >60  --  >60  ANIONGAP 14  --  13  --  9  --  10    Lipids No results for input(s): "CHOL", "TRIG", "HDL", "LABVLDL", "LDLCALC", "CHOLHDL" in  the last 168 hours.  Hematology Recent Labs  Lab 04/06/23 1407 04/06/23 1933 04/07/23 0434 04/08/23 0515  WBC 5.4  --  5.0 5.3  RBC 2.22* 2.20* 2.29* 2.96*  HGB 5.8*  --  6.1* 8.0*  HCT 19.4*  --  20.1* 26.2*  MCV 87.4  --  87.8 88.5  MCH 26.1  --  26.6 27.0  MCHC 29.9*  --  30.3 30.5  RDW 24.3*  --  22.1* 21.1*  PLT 152  --  124* 130*   Thyroid  Recent Labs  Lab 04/06/23 1933  TSH 2.938    BNP Recent Labs  Lab 04/06/23 1407  BNP 599.6*    DDimer No results for input(s): "DDIMER" in the last 168 hours.   Radiology    CT ABDOMEN PELVIS W CONTRAST  Result Date: 04/06/2023 CLINICAL DATA:  Abdominal and flank pain. Stone suspected. Bilateral leg pain and swelling. EXAM: CT ABDOMEN AND PELVIS WITH CONTRAST TECHNIQUE: Multidetector CT imaging of the abdomen and pelvis was performed using the standard protocol following bolus administration of intravenous contrast. RADIATION DOSE REDUCTION: This exam was performed according to the departmental dose-optimization program which includes automated exposure control, adjustment of the mA and/or kV according to patient size and/or use of iterative reconstruction technique. CONTRAST:  OMNIPAQUE IOHEXOL 300 MG/ML  SOLN COMPARISON:  None Available. FINDINGS: Lower chest: There is dependent atelectasis with small to moderate bilateral pleural effusions. Trace pericardial effusion evident. Hepatobiliary: No suspicious focal abnormality within the liver parenchyma. Gallbladder is nondistended. No intrahepatic or extrahepatic biliary dilation. Pancreas: No focal mass lesion. No dilatation of the main duct. No intraparenchymal cyst. No peripancreatic edema. Spleen: No focal abnormality in the spleen.  Adrenals/Urinary Tract: No adrenal nodule or mass. Kidneys unremarkable. No evidence for hydroureter. The urinary bladder appears normal for the degree of distention. Stomach/Bowel: Stomach is unremarkable. No gastric wall thickening. No evidence of  outlet obstruction. Duodenum is normally positioned as is the ligament of Treitz. No small bowel wall thickening. No small bowel dilatation. The terminal ileum is normal. The appendix is normal. No gross colonic mass. No colonic wall thickening. Vascular/Lymphatic: There is moderate atherosclerotic calcification of the abdominal aorta without aneurysm. There is no gastrohepatic or hepatoduodenal ligament lymphadenopathy. No retroperitoneal or mesenteric lymphadenopathy. No pelvic sidewall lymphadenopathy. Reproductive: The prostate gland and seminal vesicles are unremarkable. Other: Trace free fluid is seen in the pelvis. Musculoskeletal: Small bilateral groin hernias contain only fat. Body wall edema noted lower abdomen and pelvis. No worrisome lytic or sclerotic osseous abnormality. IMPRESSION: 1. No acute findings in the abdomen or pelvis. Specifically, no findings to explain the patient's history of abdominal pain. 2. Small to moderate bilateral pleural effusions with trace pericardial effusion. 3. Trace free fluid in the pelvis. This is technically abnormal in a male patient although etiology for this fluid is not evident. 4. Body wall edema. 5. Small bilateral groin hernias contain only fat. 6.  Aortic Atherosclerosis (ICD10-I70.0). Electronically Signed   By: Kennith Center M.D.   On: 04/06/2023 15:39   US Abdomen Limited RUQ (LIVER/GB)  Result Date: 04/06/2023 CLINICAL DATA:  Abnormal liver function tests. EXAM: ULTRASOUND ABDOMEN LIMITED RIGHT UPPER QUADRANT COMPARISON:  October 09, 2021. FINDINGS: Gallbladder: No cholelithiasis is noted. Gallbladder appears to be mildly contracted. Mild amount of sludge is noted. No sonographic Murphy's sign. Common bile duct: Diameter: 4 mm which is within normal limits. Liver: No focal lesion identified. Increased echogenicity of hepatic parenchyma is noted. Portal vein is patent on color Doppler imaging with normal direction of blood flow towards the liver. Other:  None. IMPRESSION: Mild amount of sludge seen within gallbladder lumen. Probable hepatic steatosis. Electronically Signed   By: Lupita Raider M.D.   On: 04/06/2023 14:02   DG Chest 2 View  Result Date: 04/06/2023 CLINICAL DATA:  Dyspnea EXAM: CHEST - 2 VIEW COMPARISON:  10/09/2021 FINDINGS: Transverse diameter of heart is increased. Central pulmonary vessels are prominent. Increased interstitial markings are seen in lower lung fields. There is blunting of both posterior costophrenic angles. There is no pneumothorax. IMPRESSION: Cardiomegaly. Increased interstitial markings are seen in lower lung fields suggesting interstitial edema or interstitial pneumonia. Small bilateral pleural effusions. Electronically Signed   By: Ernie Avena M.D.   On: 04/06/2023 13:18    Cardiac Studies   Echo 10/10/2021: IMPRESSIONS     1. Left ventricular ejection fraction, by estimation, is 60 to 65%. The  left ventricle has normal function. The left ventricle has no regional  wall motion abnormalities. Left ventricular diastolic parameters were  normal.   2. Right ventricular systolic function is normal. The right ventricular  size is normal.   3. The mitral valve is normal in structure. Trivial mitral valve  regurgitation.   4. The aortic valve is normal in structure. Aortic valve regurgitation is  not visualized.   Repeat echo pending.  Patient Profile     47 y.o. male with alcohol abuse, hypertension, thalassemia minor, and gout admitted with acute heart failure, type unknown   Assessment & Plan    # Acute heart failure, type unknown: Symptoms have been ongoing in the setting of heavy alcohol use. He reports 3 weeks of increasing dyspnea  and lower extremity edema. The type presented to the hospital he had anasarca up to his abdomen.  He is doing much better with IV Lasix.  He was net -4.5 L yesterday.  Renal function is stable.  Improving symptomatically.  Our sonographers attempted to get his  echo yesterday but he was going to the bathroom too frequently.  Will try again today.  Likely nonischemic cardiomyopathy in the setting of heavy alcohol use. If this is the case, he will need a left and right heart cath once more euvolemic. For now, continue with diuresis and determine if he needs a cath after his echo.  Continue Farxiga and losartan. Will further tailor his GDMT for heart failure based on blood pressure and echo results.   # Alcohol abuse: CIWA protocol per primary team.  Advised cessation given his new heart failure diagnosis.  # Thalassemia minor: Patient presented with hemoglobin of 5.8.  He is status post 2 units of packed red blood cells.  No history of bleeding.  Also deficient in iron and folic acid.  Hemoglobin up to 8 today.      For questions or updates, please contact [facility] Please consult [email or web address] for contact info under        Signed, Chilton Si, MD  04/08/2023, 10:39 AM

## 2023-04-09 ENCOUNTER — Telehealth (HOSPITAL_COMMUNITY): Payer: Self-pay | Admitting: Pharmacy Technician

## 2023-04-09 ENCOUNTER — Other Ambulatory Visit (HOSPITAL_COMMUNITY): Payer: Self-pay

## 2023-04-09 DIAGNOSIS — I5031 Acute diastolic (congestive) heart failure: Secondary | ICD-10-CM

## 2023-04-09 DIAGNOSIS — D649 Anemia, unspecified: Secondary | ICD-10-CM | POA: Diagnosis not present

## 2023-04-09 DIAGNOSIS — R601 Generalized edema: Secondary | ICD-10-CM | POA: Diagnosis not present

## 2023-04-09 LAB — PHOSPHORUS: Phosphorus: 3.6 mg/dL (ref 2.5–4.6)

## 2023-04-09 LAB — BPAM RBC
ISSUE DATE / TIME: 202404122055
Unit Type and Rh: 5100
Unit Type and Rh: 5100

## 2023-04-09 LAB — TYPE AND SCREEN: Unit division: 0

## 2023-04-09 LAB — CBC
HCT: 26 % — ABNORMAL LOW (ref 39.0–52.0)
Hemoglobin: 7.9 g/dL — ABNORMAL LOW (ref 13.0–17.0)
MCH: 27 pg (ref 26.0–34.0)
MCHC: 30.4 g/dL (ref 30.0–36.0)
MCV: 88.7 fL (ref 80.0–100.0)
Platelets: 140 10*3/uL — ABNORMAL LOW (ref 150–400)
RBC: 2.93 MIL/uL — ABNORMAL LOW (ref 4.22–5.81)
RDW: 20.1 % — ABNORMAL HIGH (ref 11.5–15.5)
WBC: 5.5 10*3/uL (ref 4.0–10.5)
nRBC: 1.3 % — ABNORMAL HIGH (ref 0.0–0.2)

## 2023-04-09 LAB — MAGNESIUM: Magnesium: 1.6 mg/dL — ABNORMAL LOW (ref 1.7–2.4)

## 2023-04-09 LAB — BASIC METABOLIC PANEL
Anion gap: 9 (ref 5–15)
BUN: <5 mg/dL — ABNORMAL LOW (ref 6–20)
CO2: 30 mmol/L (ref 22–32)
Calcium: 8 mg/dL — ABNORMAL LOW (ref 8.9–10.3)
Chloride: 97 mmol/L — ABNORMAL LOW (ref 98–111)
Creatinine, Ser: 0.68 mg/dL (ref 0.61–1.24)
GFR, Estimated: >60 mL/min (ref >60)
Glucose, Bld: 93 mg/dL (ref 70–99)
Potassium: 4.2 mmol/L (ref 3.5–5.1)
Sodium: 136 mmol/L (ref 135–145)

## 2023-04-09 LAB — LACTATE DEHYDROGENASE: LDH: 207 U/L — ABNORMAL HIGH (ref 98–192)

## 2023-04-09 LAB — CULTURE, BLOOD (SINGLE)

## 2023-04-09 LAB — BILIRUBIN, DIRECT: Bilirubin, Direct: 0.8 mg/dL — ABNORMAL HIGH (ref 0.0–0.2)

## 2023-04-09 MED ORDER — SODIUM CHLORIDE 0.9 % IV SOLN: INTRAVENOUS | Status: DC

## 2023-04-09 MED ORDER — SODIUM CHLORIDE 0.9 % IV SOLN: 250.0000 mL | INTRAVENOUS | Status: DC | PRN

## 2023-04-09 MED ORDER — LIDOCAINE 5 % EX PTCH
1.0000 | MEDICATED_PATCH | CUTANEOUS | Status: DC
  Administered 2023-04-09: 1 via TRANSDERMAL
  Filled 2023-04-09 (×2): qty 1

## 2023-04-09 MED ORDER — ASPIRIN 81 MG PO CHEW
81.0000 mg | CHEWABLE_TABLET | ORAL | Status: AC
  Administered 2023-04-10: 81 mg via ORAL
  Filled 2023-04-09: qty 1

## 2023-04-09 MED ORDER — SPIRONOLACTONE 12.5 MG HALF TABLET
12.5000 mg | ORAL_TABLET | Freq: Every day | ORAL | Status: DC
  Administered 2023-04-09 – 2023-04-10 (×2): 12.5 mg via ORAL
  Filled 2023-04-09 (×2): qty 1

## 2023-04-09 MED ORDER — COLCHICINE 0.6 MG PO TABS
0.6000 mg | ORAL_TABLET | Freq: Every day | ORAL | Status: DC
  Administered 2023-04-09 – 2023-04-10 (×2): 0.6 mg via ORAL
  Filled 2023-04-09 (×2): qty 1

## 2023-04-09 MED ORDER — SODIUM CHLORIDE 0.9% FLUSH: 3.0000 mL | INTRAVENOUS | Status: DC | PRN

## 2023-04-09 MED ORDER — PREDNISONE 20 MG PO TABS
40.0000 mg | ORAL_TABLET | Freq: Every day | ORAL | Status: DC
  Administered 2023-04-09 – 2023-04-10 (×2): 40 mg via ORAL
  Filled 2023-04-09 (×2): qty 2

## 2023-04-09 MED ORDER — MAGNESIUM SULFATE 2 GM/50ML IV SOLN
2.0000 g | Freq: Once | INTRAVENOUS | Status: AC
  Administered 2023-04-09: 2 g via INTRAVENOUS
  Filled 2023-04-09: qty 50

## 2023-04-09 MED ORDER — SODIUM CHLORIDE 0.9% FLUSH
3.0000 mL | Freq: Two times a day (BID) | INTRAVENOUS | Status: DC
  Administered 2023-04-09 – 2023-04-10 (×3): 3 mL via INTRAVENOUS

## 2023-04-09 NOTE — Telephone Encounter (Signed)
Patient Advocate Encounter   Received notification that prior authorization for Farxiga 10MG  tablets is required.   PA submitted on 04/09/2023 [facility]  Insurance [employer] Electronic PA Form Status is pending       Roland Earl, CPhT Pharmacy Patient Advocate Specialist [facility] Patient Advocate Team Direct Number: [phone]  Fax: [phone]

## 2023-04-09 NOTE — TOC Initial Note (Signed)
Transition of Care [facility]) - Initial/Assessment Note    Patient Details  Name: Shawn Wolfe MRN: [id number] Date of Birth: [birth year]  [facility]) CM/SW Contact:    Truddie Hidden, RN Phone Number: 04/09/2023, 2:01 PM  Clinical Narrative:               Claiborne Rigg abuse previously added to AVS by assigned TOC.         Patient Goals and CMS Choice            Expected Discharge Plan and Services                                              Prior Living Arrangements/Services                       Activities of Daily Living Home Assistive Devices/Equipment: None ADL Screening (condition at time of admission) Patient's cognitive ability adequate to safely complete daily activities?: Yes Is the patient deaf or have difficulty hearing?: No Does the patient have difficulty seeing, even when wearing glasses/contacts?: Yes Does the patient have difficulty concentrating, remembering, or making decisions?: Yes Patient able to express need for assistance with ADLs?: Yes Does the patient have difficulty dressing or bathing?: No Independently performs ADLs?: Yes (appropriate for developmental age) Does the patient have difficulty walking or climbing stairs?: No Weakness of Legs: Both Weakness of Arms/Hands: None  Permission Sought/Granted                  Emotional Assessment              Admission diagnosis:  Cardiomegaly [I51.7] Hypokalemia [E87.6] Anasarca [R60.1] Transaminitis [R74.01] Elevated bilirubin [R17] Hypervolemia, unspecified hypervolemia type [E87.70] Anemia, unspecified type [D64.9] Patient Active Problem List   Diagnosis Date Noted   Cardiomegaly 04/07/2023   Transaminitis 04/07/2023   Anemia 04/07/2023   Anasarca 04/06/2023   Acute respiratory failure with hypoxia 04/06/2023   Alcoholic liver disease 04/06/2023   Thalassemia minor 10/11/2021   Elevated LFTs 10/10/2021   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 [place] [address] Phone: [phone] Fax: [phone]     Social Determinants of Health (SDOH) Social History: SDOH Screenings   Tobacco Use: High Risk (04/07/2023)   SDOH Interventions:     Readmission Risk Interventions     No data to display

## 2023-04-09 NOTE — TOC Benefit Eligibility Note (Signed)
Patient Product/process development scientist completed.    The patient is currently admitted and upon discharge could be taking Farxiga 10 mg.  Requires Prior Authorization  The patient is currently admitted and upon discharge could be taking Jardiance 10 mg.  Requires Prior Authorization  The patient is insured through [employer] and [employer] [place] [employer]   This test claim was processed through [facility]- copay amounts may vary at other pharmacies due to pharmacy/plan contracts, or as the patient moves through the different stages of their insurance plan.  Roland Earl, CPHT Pharmacy Patient Advocate Specialist [facility] Patient Advocate Team Direct Number: [phone]  Fax: [phone]

## 2023-04-09 NOTE — Progress Notes (Addendum)
PROGRESS NOTE    Shawn Wolfe  XBJ:[id number] DOB: [birth year] DOA: 04/06/2023 PCP: Pcp, No   Brief Narrative: This 47 y.o. male with PMH  significant of heavy alcohol abuse, gout, hypertension, thalassemia minor, presented in the ED with anasarca, acute hypoxic respiratory failure, hypokalemia, hyperbilirubinemia, acute on chronic anemia.  Patient reports progressive shortness of breath over the past 1 to 2 weeks.  He reports abdominal and lower extremity swelling associated with orthopnea and PND.  Patient has not been able to lay flat for multiple weeks.  He has been drinking at least 12+ concentrated alcohol beers daily as well as mixed drinks and some liquor.  He has been noncompliant with his allopurinol.  Patient reports taking high-dose NSAIDs for pain relief.  Significant labs in the ED hemoglobin 5.8, potassium 2.1, calcium 6.9, BNP 600, chest x-ray shows cardiomegaly with increased institutional markings concerning for edema versus pneumonia.  Abdominal ultrasound gallbladder wall sludge and.  CT abdomen and pelvis shows small to moderate bilateral pleural effusion and trace pericardial effusion.  Patient is admitted for further evaluation.  Assessment & Plan:   Principal Problem:   Anasarca Active Problems:   Hypokalemia   Hyperbilirubinemia   Alcohol abuse   Thalassemia minor   Acute respiratory failure with hypoxia   Alcoholic liver disease   Cardiomegaly   Transaminitis   Anemia   Gout   Acute diastolic CHF /Anasarca: Patient presented diffusely edematous from abdomen to lower extremities with progressive orthopnea, PND, DOE Patient reports heavy EtOH use (12+ alcohol concentrated beers daily + mixed drinks)  Heavy NSAID use in the setting of baseline gout. BNP 600 with cardiomegaly on chest x-ray. CT A/P  with small to moderate bilateral pleural effusions No ascites or cirrhosis on imaging in the setting of longstanding alcohol abuse. 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 Continue Lasix 80 mg IV every 12 hours 2d Echo showed LVEF 60-65%, Moderate LVH UDS unremarkable. 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 Continue diuresis with lasix 80 mg for one more day. Continued Farxiga and losartan. Plan for right heart cath tomorrow, to more accurately assess filling pressures, PA pressure and cardiac output.   Hyperbilirubinemia: T. bili 5.7 on presentation with noted jaundice. US abdomen and CT A/P grossly negative for any acute obstruction or gallbladder distention. LFTs relatively stable apart from AST 57  Hepatitis panel > Non reactive. Consider GI consult is indicated.   Hypokalemia: Replaced. Resolved.   Acute hypoxic respiratory failure: Patient presented with progressively worsening shortness of breath, increased work of breathing. Suspect secondary to anasarca . Chest x-ray shows bilateral moderate pleural effusion and trace pericardial effusion. Continue IV diuresis. No overt infectious process of lungs noted. He is successfully weaned down to room air.  Thalassemia minor: Hgb 5.8  on arrival with baseline hgb around 8-10 . Pt denies any hematesis or bloody stools.  Stool for occult blood negative. Hemoglobin 8.0 after 2 unit PRBC. Needs outpatient hematology follow-up.   Alcohol abuse: Patient reports heavy alcohol intake. Alcohol level less than 10 Patient is considering detox Continue CIWA protocol.  Urine drug screen Negative. Continue IV PPI.   Alcoholic liver disease: Abdominal ultrasound with probable hepatic steatosis in the setting of heavy alcohol use. No overt cirrhosis, portal hypertension or ascites. Noted jaundice T. bili 5.7, INR 1.3, platelets 152 MELD score 16   Gout: Active polyarticular gout flare involving bilateral knees and left great toe. Patient not taking  allopurinol on a consistent basis. Colchicine for acute  treatment Defer NSAIDs and Solu-Medrol for now given anasarca.  Hypomagnesemia: Replaced.  Continue to monitor     DVT prophylaxis:  Lovenox Code Status: Full code Family Communication: No family at bed side. Disposition Plan:     Status is: Inpatient Remains inpatient appropriate because: Admitted for abdominal distention,  alcohol abuse and generalized anasarca.  Cardiology is consulted.  Plan for right heart cath tomorrow.   Consultants:  Cardiology  Procedures: US Abdomen, CT Abdomen and Pelvis Antimicrobials:  Anti-infectives (From admission, onward)    None      Subjective: Patient was seen and examined at bedside.  Overnight events noted. Patient reports feeling very much improved. He is weaned down to room air. He reports significant improvement in the abdominal swelling and leg swelling. Patient denies any chest pain,  states he is feeling much lighter.  Objective: Vitals:   04/08/23 1956 04/08/23 2350 04/09/23 0404 04/09/23 0918  BP: 117/78 108/67 (!) 146/92 108/70  Pulse: 81 79 71 91  Resp: 20 (!) 22 18 20   Temp: 98.5 F (36.9 C) 98.1 F (36.7 C) 98.8 F (37.1 C) 98.7 F (37.1 C)  TempSrc:  Oral  Oral  SpO2: 95% 94% 98% 94%  Weight:      Height:        Intake/Output Summary (Last 24 hours) at 04/09/2023 1059 Last data filed at 04/09/2023 4098 Gross per 24 hour  Intake 1197 ml  Output 3130 ml  Net -1933 ml   Filed Weights   04/06/23 1244  Weight: 83.9 kg    Examination:  General exam: Appears comfortable, deconditioned, not in any acute distress. Respiratory system: CTA bilaterally, respiratory effort normal, RR 15 Cardiovascular system: S1-S2 heard, regular rate and rhythm, no murmur. Gastrointestinal system: Abdomen is soft, distended, non tender, BS+ Central nervous system: Alert and oriented x 3. No focal neurological deficits. Extremities: Edema+, no cyanosis, no clubbing  Skin: No rashes, lesions or ulcers Psychiatry: Judgement and  insight appear normal. Mood & affect appropriate.     Data Reviewed: I have personally reviewed following labs and imaging studies  CBC: Recent Labs  Lab 04/06/23 1407 04/07/23 0434 04/08/23 0515 04/09/23 0450  WBC 5.4 5.0 5.3 5.5  NEUTROABS 4.3  --   --   --   HGB 5.8* 6.1* 8.0* 7.9*  HCT 19.4* 20.1* 26.2* 26.0*  MCV 87.4 87.8 88.5 88.7  PLT 152 124* 130* 140*   Basic Metabolic Panel: Recent Labs  Lab 04/06/23 1407 04/06/23 1933 04/06/23 2035 04/07/23 0431 04/07/23 0434 04/07/23 1239 04/08/23 0515 04/09/23 0450  NA 137  --  137  --  139  --  134* 136  K 2.1*  --  2.2*  --  2.8* 3.2* 3.3* 4.2  CL 87*  --  87*  --  95*  --  93* 97*  CO2 36*  --  37*  --  35*  --  31 30  GLUCOSE 117*  --  114*  --  100*  --  79 93  BUN <5*  --  <5*  --  <5*  --  6 <5*  CREATININE 0.52*  --  0.69  --  0.57*  --  0.66 0.68  CALCIUM 6.9*  --  6.9*  --  6.8*  --  7.7* 8.0*  MG  --  1.5*  --  1.6*  --   --  1.7 1.6*  PHOS  --   --   --   --   --   --  2.9 3.6   GFR: Estimated Creatinine Clearance: 114.7 mL/min (by C-G formula based on SCr of 0.68 mg/dL). Liver Function Tests: Recent Labs  Lab 04/06/23 1407 04/06/23 1933 04/07/23 0434  AST 57*  --  49*  ALT 30  --  29  ALKPHOS 40  --  38  BILITOT 5.7* 5.9* 4.9*  PROT 5.4*  --  5.1*  ALBUMIN 2.5*  --  2.3*   Recent Labs  Lab 04/06/23 1407  LIPASE 27   Recent Labs  Lab 04/06/23 1406  AMMONIA 41*   Coagulation Profile: Recent Labs  Lab 04/06/23 1407  INR 1.3*   Cardiac Enzymes: No results for input(s): "CKTOTAL", "CKMB", "CKMBINDEX", "TROPONINI" in the last 168 hours. BNP (last 3 results) No results for input(s): "PROBNP" in the last 8760 hours. HbA1C: No results for input(s): "HGBA1C" in the last 72 hours. CBG: No results for input(s): "GLUCAP" in the last 168 hours. Lipid Profile: No results for input(s): "CHOL", "HDL", "LDLCALC", "TRIG", "CHOLHDL", "LDLDIRECT" in the last 72 hours. Thyroid Function  Tests: Recent Labs    04/06/23 1933  TSH 2.938   Anemia Panel: Recent Labs    04/06/23 1933 04/06/23 2035  VITAMINB12 1,152*  --   FOLATE  --  3.5*  FERRITIN  --  517*  TIBC  --  269  IRON  --  41*  RETICCTPCT 3.4*  --    Sepsis Labs: Recent Labs  Lab 04/06/23 1407  PROCALCITON <0.10  LATICACIDVEN 1.2    Recent Results (from the past 240 hour(s))  Blood culture (routine single)     Status: None (Preliminary result)   Collection Time: 04/06/23  2:07 PM   Specimen: BLOOD RIGHT ARM  Result Value Ref Range Status   Specimen Description BLOOD RIGHT ARM  Final   Special Requests   Final    BOTTLES DRAWN AEROBIC AND ANAEROBIC Blood Culture results may not be optimal due to an inadequate volume of blood received in culture bottles   Culture   Final    NO GROWTH 3 DAYS Performed at [facility], [address] [place], [place] [address]    Report Status PENDING  Incomplete     Radiology Studies: ECHOCARDIOGRAM COMPLETE  Result Date: 04/08/2023    ECHOCARDIOGRAM REPORT   Patient Name:   LUISMIGUEL LAMERE Date of Exam: 04/08/2023 Medical Rec #:  [id number]         Height:       66.0 in Accession #:    [id number]        Weight:       185.0 lb Date of Birth:  05-Jul-1976          BSA:          1.935 m Patient Age:    46 years          BP:           121/78 mmHg Patient Gender: M                 HR:           75 bpm. Exam Location:  [facility] Procedure: 2D Echo and Strain Analysis Indications:     CHF I50.21  History:         Patient has prior history of Echocardiogram examinations, most                  recent 10/11/2021.  Sonographer:     Overton Mam RDCS Referring Phys:  [id number] STEVEN J NEWTON Diagnosing Phys: Chilton Si MD  Sonographer Comments: Global longitudinal strain was attempted. IMPRESSIONS  1. Left ventricular ejection fraction, by estimation, is 60 to 65%. The left ventricle has normal function. The left ventricle has no regional wall motion abnormalities. There  is moderate concentric left ventricular hypertrophy. Left ventricular diastolic parameters were normal. The average left ventricular global longitudinal strain is -19.4 %. The global longitudinal strain is normal.  2. Right ventricular systolic function is normal. The right ventricular size is normal. There is normal pulmonary artery systolic pressure.  3. The mitral valve is normal in structure. Trivial mitral valve regurgitation. No evidence of mitral stenosis.  4. The aortic valve is tricuspid. Aortic valve regurgitation is not visualized. No aortic stenosis is present.  5. Aortic dilatation noted. There is mild dilatation of the aortic root, measuring 42 mm.  6. The inferior vena cava is normal in size with greater than 50% respiratory variability, suggesting right atrial pressure of 3 mmHg. FINDINGS  Left Ventricle: Left ventricular ejection fraction, by estimation, is 60 to 65%. The left ventricle has normal function. The left ventricle has no regional wall motion abnormalities. The average left ventricular global longitudinal strain is -19.4 %. The global longitudinal strain is normal. The left ventricular internal cavity size was normal in size. There is moderate concentric left ventricular hypertrophy. Left ventricular diastolic parameters were normal. Right Ventricle: The right ventricular size is normal. No increase in right ventricular wall thickness. Right ventricular systolic function is normal. There is normal pulmonary artery systolic pressure. The tricuspid regurgitant velocity is 2.65 m/s, and  with an assumed right atrial pressure of 3 mmHg, the estimated right ventricular systolic pressure is 31.1 mmHg. Left Atrium: Left atrial size was normal in size. Right Atrium: Right atrial size was normal in size. Pericardium: There is no evidence of pericardial effusion. Mitral Valve: The mitral valve is normal in structure. Trivial mitral valve regurgitation. No evidence of mitral valve stenosis.  Tricuspid Valve: The tricuspid valve is normal in structure. Tricuspid valve regurgitation is trivial. No evidence of tricuspid stenosis. Aortic Valve: The aortic valve is tricuspid. Aortic valve regurgitation is not visualized. No aortic stenosis is present. Aortic valve mean gradient measures 6.0 mmHg. Aortic valve peak gradient measures 12.2 mmHg. Aortic valve area, by VTI measures 2.41  cm. Pulmonic Valve: The pulmonic valve was normal in structure. Pulmonic valve regurgitation is trivial. No evidence of pulmonic stenosis. Aorta: Aortic dilatation noted. There is mild dilatation of the aortic root, measuring 42 mm. Venous: The inferior vena cava is normal in size with greater than 50% respiratory variability, suggesting right atrial pressure of 3 mmHg. IAS/Shunts: No atrial level shunt detected by color flow Doppler.  LEFT VENTRICLE PLAX 2D LVIDd:         4.20 cm   Diastology LVIDs:         2.80 cm   LV e' medial:    8.59 cm/s LV PW:         1.60 cm   LV E/e' medial:  12.5 LV IVS:        1.50 cm   LV e' lateral:   9.79 cm/s LVOT diam:     1.80 cm   LV E/e' lateral: 10.9 LV SV:         85 LV SV Index:   44        2D Longitudinal Strain LVOT Area:     2.54 cm  2D Strain GLS (A2C):   -  22.1 %                          2D Strain GLS (A3C):   -17.5 %                          2D Strain GLS (A4C):   -18.6 %                          2D Strain GLS Avg:     -19.4 % RIGHT VENTRICLE RV Basal diam:  3.40 cm RV S prime:     16.80 cm/s TAPSE (M-mode): 2.3 cm LEFT ATRIUM             Index        RIGHT ATRIUM           Index LA diam:        3.30 cm 1.71 cm/m   RA Area:     16.30 cm LA Vol (A2C):   49.0 ml 25.33 ml/m  RA Volume:   45.80 ml  23.67 ml/m LA Vol (A4C):   43.2 ml 22.33 ml/m LA Biplane Vol: 46.0 ml 23.78 ml/m  AORTIC VALVE                     PULMONIC VALVE AV Area (Vmax):    2.20 cm      PV Vmax:        1.17 m/s AV Area (Vmean):   2.21 cm      PV Peak grad:   5.5 mmHg AV Area (VTI):     2.41 cm      RVOT Peak  grad: 4 mmHg AV Vmax:           175.00 cm/s AV Vmean:          115.000 cm/s AV VTI:            0.353 m AV Peak Grad:      12.2 mmHg AV Mean Grad:      6.0 mmHg LVOT Vmax:         151.00 cm/s LVOT Vmean:        99.700 cm/s LVOT VTI:          0.334 m LVOT/AV VTI ratio: 0.95  AORTA Ao Root diam: 4.20 cm Ao Asc diam:  3.20 cm MITRAL VALVE                TRICUSPID VALVE MV Area (PHT): 3.95 cm     TV Peak grad:   27.6 mmHg MV Decel Time: 192 msec     TV Vmax:        2.62 m/s MV E velocity: 107.00 cm/s  TR Peak grad:   28.1 mmHg MV A velocity: 96.40 cm/s   TR Vmax:        265.00 cm/s MV E/A ratio:  1.11                             SHUNTS                             Systemic VTI:  0.33 m  Systemic Diam: 1.80 cm Chilton Si MD Electronically signed by Chilton Si MD Signature Date/Time: 04/08/2023/11:55:06 AM    Final      Scheduled Meds:  sodium chloride   Intravenous Once   sodium chloride   Intravenous Once   sodium chloride   Intravenous Once   colchicine  0.6 mg Oral Daily   dapagliflozin propanediol  10 mg Oral Daily   enoxaparin (LOVENOX) injection  40 mg Subcutaneous Q24H   folic acid  1 mg Oral Daily   furosemide  80 mg Intravenous BID   losartan  12.5 mg Oral Daily   multivitamin with minerals  1 tablet Oral Daily   pantoprazole (PROTONIX) IV  40 mg Intravenous Q12H   potassium chloride  40 mEq Oral BID   sodium chloride flush  3 mL Intravenous Q12H   sodium chloride flush  3 mL Intravenous Q12H   spironolactone  12.5 mg Oral Daily   thiamine  100 mg Oral Daily   Or   thiamine  100 mg Intravenous Daily   Continuous Infusions:  sodium chloride       LOS: 3 days    Time spent: 35 mins    Willeen Niece, MD [facility]   If 7PM-7AM, please contact night-coverage

## 2023-04-09 NOTE — Consult Note (Signed)
Advanced Heart Failure Team Consult Note   Primary Physician: Pcp, No PCP-Cardiologist:  None  Reason for Consultation: CHF  HPI:    Shawn Wolfe is seen today for evaluation of CHF at the request of Dr. Kirke Corin.   Patient has history of HTN but says he was not taking meds at home.  He has gout with frequent flares. Patient has drunk ETOH heavily x years.  He has > 12 drinks/day at home.  This has worsened since he has been unemployed for the last few months (used to be a Production designer, theatre/television/film at [employer]).  No drugs or smoking.  He has never been told he has cirrhosis or liver disease.  He has never had a blood transfusion prior to this admission.  He does have a history of B12 deficiency.  Patient reports the onset of exertional dyspnea about 3 wks prior to admission.  He had had lower extremity edema prior to this.  For the last 3 wks, he has noted dyspnea walking in stores and carrying bags.  He has had orthopnea.  His abdomen and legs progressively swelled.  No chest pain.  +Early satiety.  No BRBPR/melena.  He came to the ER with these symptoms.  BNP was 600. INR mildly elevated at 1.3 and albumen 2.3.  Bilirubin high at 4.9 with AST 49 and normal ALT.  Hgb low at 5.8, B12 level normal, FOBT negative.  CXR with mild pulmonary edema.  RUQ US showed hepatic steatosis.  CT abdomen/pelvis showed small-moderate bilateral pleural effusions, trace ascites, normal spleen, normal liver.  Viral hepatitis panel was negative.    Echo was done and reviewed, EF 60-65%, moderate LVH, mildly dilated RV with normal systolic function, PA pressure does not appear elevated, IVC not dilated.   Patient received 2 units PRBCs, hgb 7.9 today. He has been diuresed with Lasix 80 mg IV bid with good response and stable creatinine. Weights not being checked. Breathing seems a bit better.  Still with peripheral edema and abdominal distention.  He reports gout flare in left elbow which is tender/warm.    PMH: 1. ETOH abuse:  Heavy drinker x years, > 12 drinks/day.  2. 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  4. B12 deficiency 5. Gout: Heavy NSAIDs use.  6. ETOH liver disease: 4/24 RUQ [place] with hepatic steatosis.  - CT abdomen/pelvis (4/24) with small-moderate bilateral pleural effusions, trace ascites, normal spleen, normal liver.  - hepatitis panel negative 4/24  Review of Systems: All systems reviewed and negative except as per HPI.   Home Medications Prior to Admission medications   Medication Sig Start Date End Date Taking? Authorizing Provider  colchicine 0.6 MG tablet Take one tablet BID until gout is gone. Thereafter, can take as needed for gout flare: take 1.2 mg x1, followed by 0.6mg  x1. 10/11/21  Yes Standley Brooking, MD  lisinopril (ZESTRIL) 20 MG tablet Take 1 tablet (20 mg total) by mouth daily. 10/12/21  Yes Standley Brooking, MD  Multiple Vitamin (MULTIVITAMIN WITH MINERALS) TABS tablet Take 1 tablet by mouth daily. 10/11/21  Yes Standley Brooking, MD  cyanocobalamin (,VITAMIN B-12,) 1000 MCG/ML injection One dose q7 days x3 doses, first dose 10/24 Patient not taking: Reported on 04/06/2023 10/11/21   Standley Brooking, MD  Iron, Ferrous Sulfate, 325 (65 Fe) MG TABS Take 325 mg by mouth daily. Patient not taking: Reported on 04/06/2023  10/11/21   Standley Brooking, MD  thiamine 100 MG tablet Take 1 tablet (100 mg total) by mouth daily. Patient not taking: Reported on 04/06/2023 10/12/21   Standley Brooking, MD  traMADol (ULTRAM) 50 MG tablet Take 1 tablet (50 mg total) by mouth every 6 (six) hours as needed. Patient not taking: Reported on 04/06/2023 02/26/23 02/26/24  Jene Every, MD    Past Surgical History: Past Surgical History:  Procedure Laterality Date   HAND SURGERY Right    mutiple fractures requiring ORIF - pins later removed.    Family History: Family History  Problem Relation Age of Onset    Hypertension Mother    Liver disease Mother    Cancer Father    Cancer Sister    Hypertension Brother     Social History: Social History   Socioeconomic History   Marital status: Single    Spouse name: Not on file   Number of children: Not on file   Years of education: Not on file   Highest education level: Not on file  Occupational History   Not on file  Tobacco Use   Smoking status: Never   Smokeless tobacco: Current    Types: Chew  Vaping Use   Vaping Use: Never used  Substance and Sexual Activity   Alcohol use: Yes    Alcohol/week: 84.0 standard drinks of alcohol    Types: 84 Cans of beer per week    Comment: at least 12 cans of mixed cocktail/malt beverage daily.   Drug use: Never   Sexual activity: Yes  Other Topics Concern   Not on file  Social History Narrative   Lives in [place], [place] w/ girlfriend.  Does not routinely exercise.     Social Determinants of Health   Financial Resource Strain: Not on file  Food Insecurity: Not on file  Transportation Needs: Not on file  Physical Activity: Not on file  Stress: Not on file  Social Connections: Not on file    Allergies:  Allergies  Allergen Reactions   Sulfa Antibiotics Hives   Latex Rash    Objective:    Vital Signs:   Temp:  [97.9 F (36.6 C)-98.8 F (37.1 C)] 98.8 F (37.1 C) (04/15 0404) Pulse Rate:  [71-81] 71 (04/15 0404) Resp:  [16-22] 18 (04/15 0404) BP: (108-146)/(67-92) 146/92 (04/15 0404) SpO2:  [93 %-98 %] 98 % (04/15 0404) Last BM Date : 04/08/23  Weight change: Filed Weights   04/06/23 1244  Weight: 83.9 kg    Intake/Output:   Intake/Output Summary (Last 24 hours) at 04/09/2023 0903 Last data filed at 04/09/2023 1610 Gross per 24 hour  Intake 1437 ml  Output 4630 ml  Net -3193 ml      Physical Exam    General:  Well appearing. No resp difficulty HEENT: normal Neck: supple. JVP 10-12 cm. Carotids 2+ bilat; no bruits. No lymphadenopathy or thyromegaly appreciated. Cor:  PMI nondisplaced. Regular rate & rhythm. No rubs, gallops or murmurs. Lungs: Mildly decreased BS at bases.  Abdomen: soft, nontender, mildly distended. No hepatosplenomegaly. No bruits or masses. Good bowel sounds. Extremities: no cyanosis, clubbing, rash. 1+ edema 1/2 to knees bilaterally. Tender, warm left elbow.  Neuro: alert & orientedx3, cranial nerves grossly intact. moves all 4 extremities w/o difficulty. Affect pleasant   Telemetry   NSR 90s  EKG    NSR, nonspecific T wave flattening (personally reviewed)  Labs   Basic Metabolic Panel: Recent Labs  Lab 04/06/23 1407 04/06/23 1933  04/06/23 2035 04/07/23 0431 04/07/23 0434 04/07/23 1239 04/08/23 0515 04/09/23 0450  NA 137  --  137  --  139  --  134* 136  K 2.1*  --  2.2*  --  2.8* 3.2* 3.3* 4.2  CL 87*  --  87*  --  95*  --  93* 97*  CO2 36*  --  37*  --  35*  --  31 30  GLUCOSE 117*  --  114*  --  100*  --  79 93  BUN <5*  --  <5*  --  <5*  --  6 <5*  CREATININE 0.52*  --  0.69  --  0.57*  --  0.66 0.68  CALCIUM 6.9*  --  6.9*  --  6.8*  --  7.7* 8.0*  MG  --  1.5*  --  1.6*  --   --  1.7 1.6*  PHOS  --   --   --   --   --   --  2.9 3.6    Liver Function Tests: Recent Labs  Lab 04/06/23 1407 04/06/23 1933 04/07/23 0434  AST 57*  --  49*  ALT 30  --  29  ALKPHOS 40  --  38  BILITOT 5.7* 5.9* 4.9*  PROT 5.4*  --  5.1*  ALBUMIN 2.5*  --  2.3*   Recent Labs  Lab 04/06/23 1407  LIPASE 27   Recent Labs  Lab 04/06/23 1406  AMMONIA 41*    CBC: Recent Labs  Lab 04/06/23 1407 04/07/23 0434 04/08/23 0515 04/09/23 0450  WBC 5.4 5.0 5.3 5.5  NEUTROABS 4.3  --   --   --   HGB 5.8* 6.1* 8.0* 7.9*  HCT 19.4* 20.1* 26.2* 26.0*  MCV 87.4 87.8 88.5 88.7  PLT 152 124* 130* 140*    Cardiac Enzymes: No results for input(s): "CKTOTAL", "CKMB", "CKMBINDEX", "TROPONINI" in the last 168 hours.  BNP: BNP (last 3 results) Recent Labs    04/06/23 1407  BNP 599.6*    ProBNP (last 3 results) No  results for input(s): "PROBNP" in the last 8760 hours.   CBG: No results for input(s): "GLUCAP" in the last 168 hours.  Coagulation Studies: Recent Labs    04/06/23 1407  LABPROT 16.0*  INR 1.3*     Imaging   ECHOCARDIOGRAM COMPLETE  Result Date: 04/08/2023    ECHOCARDIOGRAM REPORT   Patient Name:   Shawn Wolfe Date of Exam: 04/08/2023 Medical Rec #:  [id number]         Height:       66.0 in Accession #:    [id number]        Weight:       185.0 lb Date of Birth:  25-May-1976          BSA:          1.935 m Patient Age:    46 years          BP:           121/78 mmHg Patient Gender: M                 HR:           75 bpm. Exam Location:  [facility] Procedure: 2D Echo and Strain Analysis Indications:     CHF I50.21  History:         Patient has prior history of Echocardiogram examinations, most  recent 10/11/2021.  Sonographer:     Overton Mam RDCS Referring Phys:  [id number] Francoise Schaumann [place] Diagnosing Phys: Chilton Si MD  Sonographer Comments: Global longitudinal strain was attempted. IMPRESSIONS  1. Left ventricular ejection fraction, by estimation, is 60 to 65%. The left ventricle has normal function. The left ventricle has no regional wall motion abnormalities. There is moderate concentric left ventricular hypertrophy. Left ventricular diastolic parameters were normal. The average left ventricular global longitudinal strain is -19.4 %. The global longitudinal strain is normal.  2. Right ventricular systolic function is normal. The right ventricular size is normal. There is normal pulmonary artery systolic pressure.  3. The mitral valve is normal in structure. Trivial mitral valve regurgitation. No evidence of mitral stenosis.  4. The aortic valve is tricuspid. Aortic valve regurgitation is not visualized. No aortic stenosis is present.  5. Aortic dilatation noted. There is mild dilatation of the aortic root, measuring 42 mm.  6. The inferior vena cava is normal in size with  greater than 50% respiratory variability, suggesting right atrial pressure of 3 mmHg. FINDINGS  Left Ventricle: Left ventricular ejection fraction, by estimation, is 60 to 65%. The left ventricle has normal function. The left ventricle has no regional wall motion abnormalities. The average left ventricular global longitudinal strain is -19.4 %. The global longitudinal strain is normal. The left ventricular internal cavity size was normal in size. There is moderate concentric left ventricular hypertrophy. Left ventricular diastolic parameters were normal. Right Ventricle: The right ventricular size is normal. No increase in right ventricular wall thickness. Right ventricular systolic function is normal. There is normal pulmonary artery systolic pressure. The tricuspid regurgitant velocity is 2.65 m/s, and  with an assumed right atrial pressure of 3 mmHg, the estimated right ventricular systolic pressure is 31.1 mmHg. Left Atrium: Left atrial size was normal in size. Right Atrium: Right atrial size was normal in size. Pericardium: There is no evidence of pericardial effusion. Mitral Valve: The mitral valve is normal in structure. Trivial mitral valve regurgitation. No evidence of mitral valve stenosis. Tricuspid Valve: The tricuspid valve is normal in structure. Tricuspid valve regurgitation is trivial. No evidence of tricuspid stenosis. Aortic Valve: The aortic valve is tricuspid. Aortic valve regurgitation is not visualized. No aortic stenosis is present. Aortic valve mean gradient measures 6.0 mmHg. Aortic valve peak gradient measures 12.2 mmHg. Aortic valve area, by VTI measures 2.41  cm. Pulmonic Valve: The pulmonic valve was normal in structure. Pulmonic valve regurgitation is trivial. No evidence of pulmonic stenosis. Aorta: Aortic dilatation noted. There is mild dilatation of the aortic root, measuring 42 mm. Venous: The inferior vena cava is normal in size with greater than 50% respiratory variability,  suggesting right atrial pressure of 3 mmHg. IAS/Shunts: No atrial level shunt detected by color flow Doppler.  LEFT VENTRICLE PLAX 2D LVIDd:         4.20 cm   Diastology LVIDs:         2.80 cm   LV e' medial:    8.59 cm/s LV PW:         1.60 cm   LV E/e' medial:  12.5 LV IVS:        1.50 cm   LV e' lateral:   9.79 cm/s LVOT diam:     1.80 cm   LV E/e' lateral: 10.9 LV SV:         85 LV SV Index:   44        2D Longitudinal Strain  LVOT Area:     2.54 cm  2D Strain GLS (A2C):   -22.1 %                          2D Strain GLS (A3C):   -17.5 %                          2D Strain GLS (A4C):   -18.6 %                          2D Strain GLS Avg:     -19.4 % RIGHT VENTRICLE RV Basal diam:  3.40 cm RV S prime:     16.80 cm/s TAPSE (M-mode): 2.3 cm LEFT ATRIUM             Index        RIGHT ATRIUM           Index LA diam:        3.30 cm 1.71 cm/m   RA Area:     16.30 cm LA Vol (A2C):   49.0 ml 25.33 ml/m  RA Volume:   45.80 ml  23.67 ml/m LA Vol (A4C):   43.2 ml 22.33 ml/m LA Biplane Vol: 46.0 ml 23.78 ml/m  AORTIC VALVE                     PULMONIC VALVE AV Area (Vmax):    2.20 cm      PV Vmax:        1.17 m/s AV Area (Vmean):   2.21 cm      PV Peak grad:   5.5 mmHg AV Area (VTI):     2.41 cm      RVOT Peak grad: 4 mmHg AV Vmax:           175.00 cm/s AV Vmean:          115.000 cm/s AV VTI:            0.353 m AV Peak Grad:      12.2 mmHg AV Mean Grad:      6.0 mmHg LVOT Vmax:         151.00 cm/s LVOT Vmean:        99.700 cm/s LVOT VTI:          0.334 m LVOT/AV VTI ratio: 0.95  AORTA Ao Root diam: 4.20 cm Ao Asc diam:  3.20 cm MITRAL VALVE                TRICUSPID VALVE MV Area (PHT): 3.95 cm     TV Peak grad:   27.6 mmHg MV Decel Time: 192 msec     TV Vmax:        2.62 m/s MV E velocity: 107.00 cm/s  TR Peak grad:   28.1 mmHg MV A velocity: 96.40 cm/s   TR Vmax:        265.00 cm/s MV E/A ratio:  1.11                             SHUNTS                             Systemic VTI:  0.33 m  Systemic Diam: 1.80 cm Chilton Si MD Electronically signed by Chilton Si MD Signature Date/Time: 04/08/2023/11:55:06 AM    Final      Medications:     Current Medications:  sodium chloride   Intravenous Once   sodium chloride   Intravenous Once   sodium chloride   Intravenous Once   dapagliflozin propanediol  10 mg Oral Daily   enoxaparin (LOVENOX) injection  40 mg Subcutaneous Q24H   folic acid  1 mg Oral Daily   furosemide  80 mg Intravenous BID   losartan  12.5 mg Oral Daily   multivitamin with minerals  1 tablet Oral Daily   pantoprazole (PROTONIX) IV  40 mg Intravenous Q12H   potassium chloride  40 mEq Oral BID   sodium chloride flush  3 mL Intravenous Q12H   sodium chloride flush  3 mL Intravenous Q12H   thiamine  100 mg Oral Daily   Or   thiamine  100 mg Intravenous Daily    Infusions:  sodium chloride      Assessment/Plan   1. Acute diastolic CHF: Patient presented with volume overload. Echo showed EF 60-65%, moderate LVH, mildly dilated RV with normal systolic function, PA pressure does not appear elevated, IVC not dilated. He has history of HTN but was not taking meds at home. Presentation with volume overload and NYHA class III symptoms.  He has been diuresed with IV Lasix.  Still with JVD on exam, abdominal distention, and peripheral edema. Interestingly, IVC was not significantly dilated on echo. ?High output heart failure related to chronic severe anemia.  Also consider pulmonary hypertension related to liver disease though not cirrhotic by imaging or simply diastolic CHF with LVH and HTN.  - Would continue Lasix 80 mg IV bid x at least 1 more day.  - Continue Farxiga.  - Add spironolactone 12.5 mg daily.  - Will arrange for RHC tomorrow to more accurately assess filling pressures, PA pressure, and CO (?high output HF). I discussed risks/benefits with patient and he agrees to procedure.  2. Gout: Gouty flare in left elbow, seems relatively mild.  - Would  avoid NSAIDs with CHF.  - Start him on colchicine 0.6 daily.  - Can use course of prednisone if needed.  3. Anemia: Hgb 5.8 at admission, required 2 units PRBCs.  Carries history of B12 deficiency (takes at home, level ok this admission) and beta thalassemia minor.  Beta thal minor generally causes only mild anemia.  No splenomegaly noted on CT abdomen. FOBT negative, no overt bleeding.  Hgb 7.9 after 2 units PRBCs.   - Think he will need inpatient or outpatient hematology workup.  - Would assess for hemolysis, has elevated tbili => will check dbili, LDH, and haptoglobin and should assess blood smear.  4. Hepatitis:  Heavy ETOH, slight ascites noted on CT. INR mildly elevated and albumen low, suggesting impaired liver synthetic function. RUQ [place] suggestive of hepatic steatosis but not cirrhosis.  CT abdomen not suggestive of cirrhosis and no splenomegaly.  Viral hepatitic workup negative.  Tbili and AST elevated.  ?ETOH hepatitis.   - Needs to quit ETOH (discussed).  5. ETOH abuse: >12 drinks/day for a long time.  Possible ETOH hepatitis as above.  He has not had any ETOH x 5 days and no sign of severe withdrawal.   - Recommended cessation and consideration of treatment program.  6. HTN: BP appears controlled on current regimen.    Length of Stay: 3  Marca Ancona, MD  04/09/2023,  9:03 AM  Advanced Heart Failure Team Pager [phone] (M-F; 7a - 5p)  Please contact [facility] for night-coverage after hours (4p -7a ) and weekends on [email or web address]

## 2023-04-09 NOTE — Progress Notes (Addendum)
Patient complained of pain on left elbow, scored 9/10, applied ice pack, gave PRN oxycodone 5 mg, MD made aware of, MD ordered for Prednisolone daily, first dose administered.

## 2023-04-10 ENCOUNTER — Encounter
Admission: EM | Disposition: A | Discharge status: Home / Self Care | Payer: Self-pay | Source: Home / Self Care | Attending: Family Medicine

## 2023-04-10 DIAGNOSIS — D649 Anemia, unspecified: Secondary | ICD-10-CM | POA: Diagnosis not present

## 2023-04-10 DIAGNOSIS — I5031 Acute diastolic (congestive) heart failure: Secondary | ICD-10-CM | POA: Diagnosis not present

## 2023-04-10 DIAGNOSIS — R601 Generalized edema: Secondary | ICD-10-CM | POA: Diagnosis not present

## 2023-04-10 HISTORY — PX: RIGHT HEART CATH: CATH118263

## 2023-04-10 LAB — COMPREHENSIVE METABOLIC PANEL
ALT: 32 U/L (ref 0–44)
AST: 38 U/L (ref 15–41)
Albumin: 2.9 g/dL — ABNORMAL LOW (ref 3.5–5.0)
Alkaline Phosphatase: 35 U/L — ABNORMAL LOW (ref 38–126)
Anion gap: 5 (ref 5–15)
BUN: 7 mg/dL (ref 6–20)
CO2: 31 mmol/L (ref 22–32)
Calcium: 8.3 mg/dL — ABNORMAL LOW (ref 8.9–10.3)
Chloride: 99 mmol/L (ref 98–111)
Creatinine, Ser: 0.73 mg/dL (ref 0.61–1.24)
GFR, Estimated: >60 mL/min (ref >60)
Glucose, Bld: 112 mg/dL — ABNORMAL HIGH (ref 70–99)
Potassium: 4.4 mmol/L (ref 3.5–5.1)
Sodium: 135 mmol/L (ref 135–145)
Total Bilirubin: 2.2 mg/dL — ABNORMAL HIGH (ref 0.3–1.2)
Total Protein: 6.4 g/dL — ABNORMAL LOW (ref 6.5–8.1)

## 2023-04-10 LAB — CBC
HCT: 31.4 % — ABNORMAL LOW (ref 39.0–52.0)
Hemoglobin: 9.6 g/dL — ABNORMAL LOW (ref 13.0–17.0)
MCH: 27 pg (ref 26.0–34.0)
MCHC: 30.6 g/dL (ref 30.0–36.0)
MCV: 88.2 fL (ref 80.0–100.0)
Platelets: 239 10*3/uL (ref 150–400)
RBC: 3.56 MIL/uL — ABNORMAL LOW (ref 4.22–5.81)
RDW: 19.6 % — ABNORMAL HIGH (ref 11.5–15.5)
WBC: 6.6 10*3/uL (ref 4.0–10.5)
nRBC: 0.5 % — ABNORMAL HIGH (ref 0.0–0.2)

## 2023-04-10 LAB — POCT I-STAT EG7
Acid-Base Excess: 3 mmol/L — ABNORMAL HIGH (ref 0.0–2.0)
Bicarbonate: 26.8 mmol/L (ref 20.0–28.0)
Calcium, Ion: 1.17 mmol/L (ref 1.15–1.40)
HCT: 28 % — ABNORMAL LOW (ref 39.0–52.0)
Hemoglobin: 9.5 g/dL — ABNORMAL LOW (ref 13.0–17.0)
O2 Saturation: 71 %
Potassium: 4.6 mmol/L (ref 3.5–5.1)
Sodium: 135 mmol/L (ref 135–145)
TCO2: 28 mmol/L (ref 22–32)
pCO2, Ven: 37.9 mmHg — ABNORMAL LOW (ref 44–60)
pH, Ven: 7.457 — ABNORMAL HIGH (ref 7.25–7.43)
pO2, Ven: 35 mmHg (ref 32–45)

## 2023-04-10 SURGERY — RIGHT HEART CATH
Anesthesia: Moderate Sedation

## 2023-04-10 MED ORDER — DAPAGLIFLOZIN PROPANEDIOL 10 MG PO TABS: 10.0000 mg | ORAL_TABLET | Freq: Every day | ORAL | 1 refills | Status: DC

## 2023-04-10 MED ORDER — HEPARIN (PORCINE) IN NACL 1000-0.9 UT/500ML-% IV SOLN
INTRAVENOUS | Status: DC | PRN
  Administered 2023-04-10 (×2): 500 mL

## 2023-04-10 MED ORDER — LOSARTAN POTASSIUM 25 MG PO TABS: 12.5000 mg | ORAL_TABLET | Freq: Every day | ORAL | 1 refills | Status: DC

## 2023-04-10 MED ORDER — SPIRONOLACTONE 25 MG PO TABS: 12.5000 mg | ORAL_TABLET | Freq: Every day | ORAL | 1 refills | Status: DC

## 2023-04-10 MED ORDER — PREDNISONE 20 MG PO TABS: 40.0000 mg | ORAL_TABLET | Freq: Every day | ORAL | 0 refills | Status: AC

## 2023-04-10 SURGICAL SUPPLY — 7 items
CATH BALLN WEDGE 5F 110CM (CATHETERS) IMPLANT
DRAPE BRACHIAL (DRAPES) IMPLANT
PACK CARDIAC CATH (CUSTOM PROCEDURE TRAY) IMPLANT
PROTECTION STATION PRESSURIZED (MISCELLANEOUS) ×1
SET ATX-X65L (MISCELLANEOUS) IMPLANT
SHEATH GLIDE SLENDER 4/5FR (SHEATH) IMPLANT
STATION PROTECTION PRESSURIZED (MISCELLANEOUS) IMPLANT

## 2023-04-10 NOTE — Progress Notes (Signed)
RHC looks normal.  Possible contribution of high cardiac output to HF when he was more anemic.  Would continue Farxiga and spironolactone for home.  Would use Lasix 20 mg daily prn weight gain.  We will arrange cardiology followup, can go home today from my standpoint.   Marca Ancona 04/10/2023 12:18 PM

## 2023-04-10 NOTE — Discharge Summary (Signed)
Physician Discharge Summary  Shawn Wolfe ZOX:[id number] DOB: 09-17-1976 DOA: 04/06/2023  PCP: Pcp, No  Admit date: 04/06/2023  Discharge date: 04/10/2023  Admitted From: Home.  Disposition: Home  Recommendations for Outpatient Follow-up:  Follow up with PCP in 1-2 weeks. Please obtain BMP/CBC in one week. Advised to follow up Cardiology as scheduled. Advised to take lasix as needed.  Discharge medications: Farxiga 10 mg daily Aldactone 12.5 mg daily Losartan 12.5 mg daily Prednisone 40 mg daily for 3 days for gout  Home Health:None Equipment/Devices:None  Discharge Condition: Good CODE STATUS:Full code Diet recommendation: Heart Healthy  Brief Summary/ Hospital Course: This 47 y.o. male with PMH  significant of heavy alcohol abuse, gout, hypertension, thalassemia minor, presented in the ED with anasarca, acute hypoxic respiratory failure, hypokalemia, hyperbilirubinemia, acute on chronic anemia.  Patient reports progressive shortness of breath over the past 1 to 2 weeks.  He reports abdominal and lower extremity swelling associated with orthopnea and PND.  Patient has not been able to lay flat for multiple weeks.  He has been drinking at least 12+ concentrated alcohol beers daily as well as mixed drinks and some liquor.  He has been noncompliant with his allopurinol.  Patient reports taking high-dose NSAIDs for pain relief.  Significant labs in the ED hemoglobin 5.8, potassium 2.1, calcium 6.9, BNP 600, chest x-ray shows cardiomegaly with increased institutional markings concerning for edema versus pneumonia.  Abdominal ultrasound gallbladder wall sludge and CT abdomen and pelvis shows small to moderate bilateral pleural effusion and trace pericardial effusion.  Patient is admitted for further evaluation.  Patient was given 2 units of packed red blood cells which improved his hemoglobin.  Cardiology was consulted.  Patient was continued on aggressive diuresis.  Cardiology recommended  right heart cath which was unremarkable.  Patient likely had high-output failure from anemia at the time of admission.  Cardiology recommended patient can be discharged from cardiology perspective.  Advised patient to refrain from drinking alcohol.  Patient is being discharged home.  Discharge Diagnoses:  Principal Problem:   Anasarca Active Problems:   Hypokalemia   Hyperbilirubinemia   Alcohol abuse   Thalassemia minor   Acute respiratory failure with hypoxia   Alcoholic liver disease   Cardiomegaly   Transaminitis   Anemia   Gout  Acute diastolic CHF /Anasarca: Patient presented diffusely edematous from abdomen to lower extremities with progressive orthopnea, PND, DOE Patient reports heavy EtOH use (12+ alcohol concentrated beers daily + mixed drinks)  Heavy NSAID use in the setting of baseline gout. BNP 600 with cardiomegaly on chest x-ray. CT A/P  with small to moderate bilateral pleural effusions No ascites or cirrhosis on imaging in the setting of longstanding alcohol abuse. 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 Continue Lasix 80 mg IV every 12 hours 2d Echo showed LVEF 60-65%, Moderate LVH UDS unremarkable. 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 Continue diuresis with lasix 80 mg for one more day. Continued Farxiga and losartan. Patient underwent right heart cath which was unremarkable. Cardiology recommended patient can be discharged,  he is back to euvolemic state.   Hyperbilirubinemia: T. bili 5.7 on presentation with noted jaundice. US abdomen and CT A/P grossly negative for any acute obstruction or gallbladder distention. LFTs relatively stable apart from AST 57  Hepatitis panel > Non reactive. Consider GI consult is indicated.   Hypokalemia: Replaced. Resolved.   Acute hypoxic respiratory failure: > Resolved. Patient presented with progressively worsening shortness  of breath,  increased work of breathing. Suspect secondary to anasarca . Chest x-ray shows bilateral moderate pleural effusion and trace pericardial effusion. Continue IV diuresis. No overt infectious process of lungs noted. He is successfully weaned down to room air.   Thalassemia minor: Hgb 5.8  on arrival with baseline hgb around 8-10 . Pt denies any hematesis or bloody stools.  Stool for occult blood negative. Hemoglobin 8.0 after 2 unit PRBC. Needs outpatient hematology follow-up.   Alcohol abuse: Patient reports heavy alcohol intake. Alcohol level less than 10 Patient is considering detox Continue CIWA protocol.  Urine drug screen Negative. Continue IV PPI.   Alcoholic liver disease: Abdominal ultrasound with probable hepatic steatosis in the setting of heavy alcohol use. No overt cirrhosis, portal hypertension or ascites. Noted jaundice T. bili 5.7, INR 1.3, platelets 152 MELD score 16   Gout: Active polyarticular gout flare involving bilateral knees and left great toe. Patient not taking allopurinol on a consistent basis. Colchicine for acute treatment Defer NSAIDs and Solu-Medrol for now given anasarca.   Hypomagnesemia: Replaced.  Resolved.  Discharge Instructions  Discharge Instructions     Call MD for:  difficulty breathing, headache or visual disturbances   Complete by: As directed    Call MD for:  persistant dizziness or light-headedness   Complete by: As directed    Call MD for:  persistant nausea and vomiting   Complete by: As directed    Diet - low sodium heart healthy   Complete by: As directed    Diet Carb Modified   Complete by: As directed    Discharge instructions   Complete by: As directed    Advised to follow up Cardiology as scheduled. Advised to take lasix as needed.  Discharge medications: Farxiga 10 mg daily Aldactone 12.5 mg daily Losartan 12.5 mg daily Prednisone 40 mg daily for 3 days for gout   Increase activity slowly   Complete by: As  directed       Allergies as of 04/10/2023       Reactions   Sulfa Antibiotics Hives   Latex Rash        Medication List     STOP taking these medications    cyanocobalamin 1000 MCG/ML injection Commonly known as: VITAMIN B12   Iron (Ferrous Sulfate) 325 (65 Fe) MG Tabs   lisinopril 20 MG tablet Commonly known as: ZESTRIL   thiamine 100 MG tablet Commonly known as: VITAMIN B1   traMADol 50 MG tablet Commonly known as: Ultram       TAKE these medications    colchicine 0.6 MG tablet Take one tablet BID until gout is gone. Thereafter, can take as needed for gout flare: take 1.2 mg x1, followed by 0.6mg  x1.   dapagliflozin propanediol 10 MG Tabs tablet Commonly known as: FARXIGA Take 1 tablet (10 mg total) by mouth daily. Start taking on: April 11, 2023   losartan 25 MG tablet Commonly known as: COZAAR Take 0.5 tablets (12.5 mg total) by mouth daily. Start taking on: April 11, 2023   multivitamin with minerals Tabs tablet Take 1 tablet by mouth daily.   predniSONE 20 MG tablet Commonly known as: DELTASONE Take 2 tablets (40 mg total) by mouth daily with breakfast for 3 days. Start taking on: April 11, 2023   spironolactone 25 MG tablet Commonly known as: ALDACTONE Take 0.5 tablets (12.5 mg total) by mouth daily. Start taking on: April 11, 2023        Follow-up Information  Iran Ouch, MD Follow up in 2 week(s).   Specialty: Cardiology Why: Please reach out to your PCP to get a referral set up to be seen with Cardiologist. Thanks! Contact information: [address] [place] [address] [phone]                Allergies  Allergen Reactions   Sulfa Antibiotics Hives   Latex Rash    Consultations: Cardiology   Procedures/Studies: CARDIAC CATHETERIZATION  Result Date: 04/10/2023 1. Normal filling pressures. 2. Upper normal cardiac output. It is possible that high output when more anemic helped  trigger CHF.  Currently, pressures look great.  He is ready for home.  He should continue [place] and spironolactone at home, would give Lasix 20 mg for prn use.   ECHOCARDIOGRAM COMPLETE  Result Date: 04/08/2023    ECHOCARDIOGRAM REPORT   Patient Name:   DAMEK ENDE Date of Exam: 04/08/2023 Medical Rec #:  [id number]         Height:       66.0 in Accession #:    [id number]        Weight:       185.0 lb Date of Birth:  Oct 25, 1976          BSA:          1.935 m Patient Age:    46 years          BP:           121/78 mmHg Patient Gender: M                 HR:           75 bpm. Exam Location:  [facility] Procedure: 2D Echo and Strain Analysis Indications:     CHF I50.21  History:         Patient has prior history of Echocardiogram examinations, most                  recent 10/11/2021.  Sonographer:     Overton Mam RDCS Referring Phys:  [id number] Francoise Schaumann [place] Diagnosing Phys: Chilton Si MD  Sonographer Comments: Global longitudinal strain was attempted. IMPRESSIONS  1. Left ventricular ejection fraction, by estimation, is 60 to 65%. The left ventricle has normal function. The left ventricle has no regional wall motion abnormalities. There is moderate concentric left ventricular hypertrophy. Left ventricular diastolic parameters were normal. The average left ventricular global longitudinal strain is -19.4 %. The global longitudinal strain is normal.  2. Right ventricular systolic function is normal. The right ventricular size is normal. There is normal pulmonary artery systolic pressure.  3. The mitral valve is normal in structure. Trivial mitral valve regurgitation. No evidence of mitral stenosis.  4. The aortic valve is tricuspid. Aortic valve regurgitation is not visualized. No aortic stenosis is present.  5. Aortic dilatation noted. There is mild dilatation of the aortic root, measuring 42 mm.  6. The inferior vena cava is normal in size with greater than 50% respiratory variability, suggesting right atrial  pressure of 3 mmHg. FINDINGS  Left Ventricle: Left ventricular ejection fraction, by estimation, is 60 to 65%. The left ventricle has normal function. The left ventricle has no regional wall motion abnormalities. The average left ventricular global longitudinal strain is -19.4 %. The global longitudinal strain is normal. The left ventricular internal cavity size was normal in size. There is moderate concentric left ventricular hypertrophy. Left ventricular diastolic parameters were normal. Right Ventricle: The  right ventricular size is normal. No increase in right ventricular wall thickness. Right ventricular systolic function is normal. There is normal pulmonary artery systolic pressure. The tricuspid regurgitant velocity is 2.65 m/s, and  with an assumed right atrial pressure of 3 mmHg, the estimated right ventricular systolic pressure is 31.1 mmHg. Left Atrium: Left atrial size was normal in size. Right Atrium: Right atrial size was normal in size. Pericardium: There is no evidence of pericardial effusion. Mitral Valve: The mitral valve is normal in structure. Trivial mitral valve regurgitation. No evidence of mitral valve stenosis. Tricuspid Valve: The tricuspid valve is normal in structure. Tricuspid valve regurgitation is trivial. No evidence of tricuspid stenosis. Aortic Valve: The aortic valve is tricuspid. Aortic valve regurgitation is not visualized. No aortic stenosis is present. Aortic valve mean gradient measures 6.0 mmHg. Aortic valve peak gradient measures 12.2 mmHg. Aortic valve area, by VTI measures 2.41  cm. Pulmonic Valve: The pulmonic valve was normal in structure. Pulmonic valve regurgitation is trivial. No evidence of pulmonic stenosis. Aorta: Aortic dilatation noted. There is mild dilatation of the aortic root, measuring 42 mm. Venous: The inferior vena cava is normal in size with greater than 50% respiratory variability, suggesting right atrial pressure of 3 mmHg. IAS/Shunts: No atrial level  shunt detected by color flow Doppler.  LEFT VENTRICLE PLAX 2D LVIDd:         4.20 cm   Diastology LVIDs:         2.80 cm   LV e' medial:    8.59 cm/s LV PW:         1.60 cm   LV E/e' medial:  12.5 LV IVS:        1.50 cm   LV e' lateral:   9.79 cm/s LVOT diam:     1.80 cm   LV E/e' lateral: 10.9 LV SV:         85 LV SV Index:   44        2D Longitudinal Strain LVOT Area:     2.54 cm  2D Strain GLS (A2C):   -22.1 %                          2D Strain GLS (A3C):   -17.5 %                          2D Strain GLS (A4C):   -18.6 %                          2D Strain GLS Avg:     -19.4 % RIGHT VENTRICLE RV Basal diam:  3.40 cm RV S prime:     16.80 cm/s TAPSE (M-mode): 2.3 cm LEFT ATRIUM             Index        RIGHT ATRIUM           Index LA diam:        3.30 cm 1.71 cm/m   RA Area:     16.30 cm LA Vol (A2C):   49.0 ml 25.33 ml/m  RA Volume:   45.80 ml  23.67 ml/m LA Vol (A4C):   43.2 ml 22.33 ml/m LA Biplane Vol: 46.0 ml 23.78 ml/m  AORTIC VALVE                     PULMONIC  VALVE AV Area (Vmax):    2.20 cm      PV Vmax:        1.17 m/s AV Area (Vmean):   2.21 cm      PV Peak grad:   5.5 mmHg AV Area (VTI):     2.41 cm      RVOT Peak grad: 4 mmHg AV Vmax:           175.00 cm/s AV Vmean:          115.000 cm/s AV VTI:            0.353 m AV Peak Grad:      12.2 mmHg AV Mean Grad:      6.0 mmHg LVOT Vmax:         151.00 cm/s LVOT Vmean:        99.700 cm/s LVOT VTI:          0.334 m LVOT/AV VTI ratio: 0.95  AORTA Ao Root diam: 4.20 cm Ao Asc diam:  3.20 cm MITRAL VALVE                TRICUSPID VALVE MV Area (PHT): 3.95 cm     TV Peak grad:   27.6 mmHg MV Decel Time: 192 msec     TV Vmax:        2.62 m/s MV E velocity: 107.00 cm/s  TR Peak grad:   28.1 mmHg MV A velocity: 96.40 cm/s   TR Vmax:        265.00 cm/s MV E/A ratio:  1.11                             SHUNTS                             Systemic VTI:  0.33 m                             Systemic Diam: 1.80 cm Chilton Si MD Electronically signed by Chilton Si MD Signature Date/Time: 04/08/2023/11:55:06 AM    Final    CT ABDOMEN PELVIS W CONTRAST  Result Date: 04/06/2023 CLINICAL DATA:  Abdominal and flank pain. Stone suspected. Bilateral leg pain and swelling. EXAM: CT ABDOMEN AND PELVIS WITH CONTRAST TECHNIQUE: Multidetector CT imaging of the abdomen and pelvis was performed using the standard protocol following bolus administration of intravenous contrast. RADIATION DOSE REDUCTION: This exam was performed according to the departmental dose-optimization program which includes automated exposure control, adjustment of the mA and/or kV according to patient size and/or use of iterative reconstruction technique. CONTRAST:  OMNIPAQUE IOHEXOL 300 MG/ML  SOLN COMPARISON:  None Available. FINDINGS: Lower chest: There is dependent atelectasis with small to moderate bilateral pleural effusions. Trace pericardial effusion evident. Hepatobiliary: No suspicious focal abnormality within the liver parenchyma. Gallbladder is nondistended. No intrahepatic or extrahepatic biliary dilation. Pancreas: No focal mass lesion. No dilatation of the main duct. No intraparenchymal cyst. No peripancreatic edema. Spleen: No focal abnormality in the spleen. Adrenals/Urinary Tract: No adrenal nodule or mass. Kidneys unremarkable. No evidence for hydroureter. The urinary bladder appears normal for the degree of distention. Stomach/Bowel: Stomach is unremarkable. No gastric wall thickening. No evidence of outlet obstruction. Duodenum is normally positioned as is the ligament of Treitz. No small bowel wall thickening. No small bowel dilatation. The terminal ileum  is normal. The appendix is normal. No gross colonic mass. No colonic wall thickening. Vascular/Lymphatic: There is moderate atherosclerotic calcification of the abdominal aorta without aneurysm. There is no gastrohepatic or hepatoduodenal ligament lymphadenopathy. No retroperitoneal or mesenteric lymphadenopathy. No pelvic  sidewall lymphadenopathy. Reproductive: The prostate gland and seminal vesicles are unremarkable. Other: Trace free fluid is seen in the pelvis. Musculoskeletal: Small bilateral groin hernias contain only fat. Body wall edema noted lower abdomen and pelvis. No worrisome lytic or sclerotic osseous abnormality. IMPRESSION: 1. No acute findings in the abdomen or pelvis. Specifically, no findings to explain the patient's history of abdominal pain. 2. Small to moderate bilateral pleural effusions with trace pericardial effusion. 3. Trace free fluid in the pelvis. This is technically abnormal in a male patient although etiology for this fluid is not evident. 4. Body wall edema. 5. Small bilateral groin hernias contain only fat. 6.  Aortic Atherosclerosis (ICD10-I70.0). Electronically Signed   By: Kennith Center M.D.   On: 04/06/2023 15:39   US Abdomen Limited RUQ (LIVER/GB)  Result Date: 04/06/2023 CLINICAL DATA:  Abnormal liver function tests. EXAM: ULTRASOUND ABDOMEN LIMITED RIGHT UPPER QUADRANT COMPARISON:  October 09, 2021. FINDINGS: Gallbladder: No cholelithiasis is noted. Gallbladder appears to be mildly contracted. Mild amount of sludge is noted. No sonographic Murphy's sign. Common bile duct: Diameter: 4 mm which is within normal limits. Liver: No focal lesion identified. Increased echogenicity of hepatic parenchyma is noted. Portal vein is patent on color Doppler imaging with normal direction of blood flow towards the liver. Other: None. IMPRESSION: Mild amount of sludge seen within gallbladder lumen. Probable hepatic steatosis. Electronically Signed   By: Lupita Raider M.D.   On: 04/06/2023 14:02   DG Chest 2 View  Result Date: 04/06/2023 CLINICAL DATA:  Dyspnea EXAM: CHEST - 2 VIEW COMPARISON:  10/09/2021 FINDINGS: Transverse diameter of heart is increased. Central pulmonary vessels are prominent. Increased interstitial markings are seen in lower lung fields. There is blunting of both posterior  costophrenic angles. There is no pneumothorax. IMPRESSION: Cardiomegaly. Increased interstitial markings are seen in lower lung fields suggesting interstitial edema or interstitial pneumonia. Small bilateral pleural effusions. Electronically Signed   By: Ernie Avena M.D.   On: 04/06/2023 13:18     Subjective: Seen and examined at bedside.  Overnight events noted.   Patient report doing much better and wants to be discharged. Cardiology cleared the patient and patient being discharged.  Discharge Exam: Vitals:   04/10/23 1206 04/10/23 1211  BP: 126/81 134/84  Pulse: 77 72  Resp: 14 15  Temp:    SpO2: 96% 96%   Vitals:   04/10/23 1156 04/10/23 1201 04/10/23 1206 04/10/23 1211  BP: 138/82 (!) 140/84 126/81 134/84  Pulse: 79 75 77 72  Resp: 15 16 14 15   Temp:      TempSrc:      SpO2: 96% 96% 96% 96%  Weight:      Height:        General: Pt is alert, awake, not in acute distress Cardiovascular: RRR, S1/S2 +, no rubs, no gallops Respiratory: CTA bilaterally, no wheezing, no rhonchi Abdominal: Soft, NT, ND, bowel sounds + Extremities: no edema, no cyanosis    The results of significant diagnostics from this hospitalization (including imaging, microbiology, ancillary and laboratory) are listed below for reference.     Microbiology: Recent Results (from the past 240 hour(s))  Blood culture (routine single)     Status: None (Preliminary result)   Collection Time: 04/06/23  2:07 PM   Specimen: BLOOD RIGHT ARM  Result Value Ref Range Status   Specimen Description BLOOD RIGHT ARM  Final   Special Requests   Final    BOTTLES DRAWN AEROBIC AND ANAEROBIC Blood Culture results may not be optimal due to an inadequate volume of blood received in culture bottles   Culture   Final    NO GROWTH 4 DAYS Performed at [facility], [address] [place], [place] [address]    Report Status PENDING  Incomplete     Labs: BNP (last 3 results) Recent Labs     04/06/23 1407  BNP 599.6*   Basic Metabolic Panel: Recent Labs  Lab 04/06/23 1933 04/06/23 2035 04/07/23 0431 04/07/23 0434 04/07/23 1239 04/08/23 0515 04/09/23 0450 04/10/23 0458 04/10/23 1206  NA  --  137  --  139  --  134* 136 135 135  K  --  2.2*  --  2.8* 3.2* 3.3* 4.2 4.4 4.6  CL  --  87*  --  95*  --  93* 97* 99  --   CO2  --  37*  --  35*  --  31 30 31   --   GLUCOSE  --  114*  --  100*  --  79 93 112*  --   BUN  --  <5*  --  <5*  --  6 <5* 7  --   CREATININE  --  0.69  --  0.57*  --  0.66 0.68 0.73  --   CALCIUM  --  6.9*  --  6.8*  --  7.7* 8.0* 8.3*  --   MG 1.5*  --  1.6*  --   --  1.7 1.6*  --   --   PHOS  --   --   --   --   --  2.9 3.6  --   --    Liver Function Tests: Recent Labs  Lab 04/06/23 1407 04/06/23 1933 04/07/23 0434 04/10/23 0458  AST 57*  --  49* 38  ALT 30  --  29 32  ALKPHOS 40  --  38 35*  BILITOT 5.7* 5.9* 4.9* 2.2*  PROT 5.4*  --  5.1* 6.4*  ALBUMIN 2.5*  --  2.3* 2.9*   Recent Labs  Lab 04/06/23 1407  LIPASE 27   Recent Labs  Lab 04/06/23 1406  AMMONIA 41*   CBC: Recent Labs  Lab 04/06/23 1407 04/07/23 0434 04/08/23 0515 04/09/23 0450 04/10/23 1000 04/10/23 1206  WBC 5.4 5.0 5.3 5.5 6.6  --   NEUTROABS 4.3  --   --   --   --   --   HGB 5.8* 6.1* 8.0* 7.9* 9.6* 9.5*  HCT 19.4* 20.1* 26.2* 26.0* 31.4* 28.0*  MCV 87.4 87.8 88.5 88.7 88.2  --   PLT 152 124* 130* 140* 239  --    Cardiac Enzymes: No results for input(s): "CKTOTAL", "CKMB", "CKMBINDEX", "TROPONINI" in the last 168 hours. BNP: Invalid input(s): "POCBNP" CBG: No results for input(s): "GLUCAP" in the last 168 hours. D-Dimer No results for input(s): "DDIMER" in the last 72 hours. Hgb A1c No results for input(s): "HGBA1C" in the last 72 hours. Lipid Profile No results for input(s): "CHOL", "HDL", "LDLCALC", "TRIG", "CHOLHDL", "LDLDIRECT" in the last 72 hours. Thyroid function studies No results for input(s): "TSH", "T4TOTAL", "T3FREE", "THYROIDAB" in  the last 72 hours.  Invalid input(s): "FREET3" Anemia work up No results for input(s): "VITAMINB12", "FOLATE", "FERRITIN", "TIBC", "  IRON", "RETICCTPCT" in the last 72 hours. Urinalysis    Component Value Date/Time   COLORURINE YELLOW (A) 04/06/2023 1407   APPEARANCEUR CLEAR (A) 04/06/2023 1407   LABSPEC 1.008 04/06/2023 1407   PHURINE 9.0 (H) 04/06/2023 1407   GLUCOSEU NEGATIVE 04/06/2023 1407   HGBUR NEGATIVE 04/06/2023 1407   BILIRUBINUR NEGATIVE 04/06/2023 1407   KETONESUR NEGATIVE 04/06/2023 1407   PROTEINUR NEGATIVE 04/06/2023 1407   NITRITE NEGATIVE 04/06/2023 1407   LEUKOCYTESUR NEGATIVE 04/06/2023 1407   Sepsis Labs Recent Labs  Lab 04/07/23 0434 04/08/23 0515 04/09/23 0450 04/10/23 1000  WBC 5.0 5.3 5.5 6.6   Microbiology Recent Results (from the past 240 hour(s))  Blood culture (routine single)     Status: None (Preliminary result)   Collection Time: 04/06/23  2:07 PM   Specimen: BLOOD RIGHT ARM  Result Value Ref Range Status   Specimen Description BLOOD RIGHT ARM  Final   Special Requests   Final    BOTTLES DRAWN AEROBIC AND ANAEROBIC Blood Culture results may not be optimal due to an inadequate volume of blood received in culture bottles   Culture   Final    NO GROWTH 4 DAYS Performed at [facility], [address], [place], [place] [address]    Report Status PENDING  Incomplete     Time coordinating discharge: Over 30 minutes  SIGNED:   Willeen Niece, MD  [facility] 04/10/2023, 3:36 PM Pager

## 2023-04-10 NOTE — Interval H&P Note (Signed)
History and Physical Interval Note:  04/10/2023 10:25 AM  Shawn Wolfe  has presented today for surgery, with the diagnosis of CHF.  The various methods of treatment have been discussed with the patient and family. After consideration of risks, benefits and other options for treatment, the patient has consented to  Procedure(s): RIGHT HEART CATH (N/A) as a surgical intervention.  The patient's history has been reviewed, patient examined, no change in status, stable for surgery.  I have reviewed the patient's chart and labs.  Questions were answered to the patient's satisfaction.     Bradlee Bridgers [employer]

## 2023-04-10 NOTE — H&P (View-Only) (Signed)
Patient ID: Shawn Wolfe, male   DOB: 01/10/1976, 47 y.o.   MRN: [id number]     Advanced Heart Failure Rounding Note  PCP-Cardiologist: None   Subjective:    Breathing is better.  Excellent diuresis yesterday. I/Os net negative 3110.  Creatinine stable 0.73.     Objective:   Weight Range: 83.9 kg Body mass index is 29.86 kg/m.   Vital Signs:   Temp:  [98.1 F (36.7 C)-99.6 F (37.6 C)] 98.6 F (37 C) (04/16 0832) Pulse Rate:  [75-91] 79 (04/16 0832) Resp:  [16-20] 16 (04/16 0832) BP: (107-131)/(70-85) 121/80 (04/16 0832) SpO2:  [89 %-98 %] 97 % (04/16 0832) Last BM Date : 04/09/23  Weight change: Filed Weights   04/06/23 1244  Weight: 83.9 kg    Intake/Output:   Intake/Output Summary (Last 24 hours) at 04/10/2023 0917 Last data filed at 04/10/2023 0613 Gross per 24 hour  Intake 240 ml  Output 3350 ml  Net -3110 ml      Physical Exam    General:  Well appearing. No resp difficulty HEENT: Normal Neck: Supple. JVP not elevated. Carotids 2+ bilat; no bruits. No lymphadenopathy or thyromegaly appreciated. Cor: PMI nondisplaced. Regular rate & rhythm. No rubs, gallops or murmurs. Lungs: Clear Abdomen: Soft, nontender, nondistended. No hepatosplenomegaly. No bruits or masses. Good bowel sounds. Extremities: No cyanosis, clubbing, rash. Trace ankle edema.  Neuro: Alert & orientedx3, cranial nerves grossly intact. moves all 4 extremities w/o difficulty. Affect pleasant   Telemetry   NSR, personally reviewed   Labs    CBC Recent Labs    04/08/23 0515 04/09/23 0450  WBC 5.3 5.5  HGB 8.0* 7.9*  HCT 26.2* 26.0*  MCV 88.5 88.7  PLT 130* 140*   Basic Metabolic Panel Recent Labs    04/08/23 0515 04/09/23 0450 04/10/23 0458  NA 134* 136 135  K 3.3* 4.2 4.4  CL 93* 97* 99  CO2 31 30 31  GLUCOSE 79 93 112*  BUN 6 <5* 7  CREATININE 0.66 0.68 0.73  CALCIUM 7.7* 8.0* 8.3*  MG 1.7 1.6*  --   PHOS 2.9 3.6  --    Liver Function Tests Recent Labs     04/10/23 0458  AST 38  ALT 32  ALKPHOS 35*  BILITOT 2.2*  PROT 6.4*  ALBUMIN 2.9*   No results for input(s): "LIPASE", "AMYLASE" in the last 72 hours. Cardiac Enzymes No results for input(s): "CKTOTAL", "CKMB", "CKMBINDEX", "TROPONINI" in the last 72 hours.  BNP: BNP (last 3 results) Recent Labs    04/06/23 1407  BNP 599.6*    ProBNP (last 3 results) No results for input(s): "PROBNP" in the last 8760 hours.   D-Dimer No results for input(s): "DDIMER" in the last 72 hours. Hemoglobin A1C No results for input(s): "HGBA1C" in the last 72 hours. Fasting Lipid Panel No results for input(s): "CHOL", "HDL", "LDLCALC", "TRIG", "CHOLHDL", "LDLDIRECT" in the last 72 hours. Thyroid Function Tests No results for input(s): "TSH", "T4TOTAL", "T3FREE", "THYROIDAB" in the last 72 hours.  Invalid input(s): "FREET3"  Other results:   Imaging    No results found.   Medications:     Scheduled Medications:  sodium chloride   Intravenous Once   sodium chloride   Intravenous Once   sodium chloride   Intravenous Once   colchicine  0.6 mg Oral Daily   dapagliflozin propanediol  10 mg Oral Daily   enoxaparin (LOVENOX) injection  40 mg Subcutaneous Q24H   folic acid  1 mg   Oral Daily   furosemide  80 mg Intravenous BID   lidocaine  1 patch Transdermal Q24H   lidocaine  1 patch Transdermal Q24H   lidocaine  1 patch Transdermal Q24H   losartan  12.5 mg Oral Daily   multivitamin with minerals  1 tablet Oral Daily   pantoprazole (PROTONIX) IV  40 mg Intravenous Q12H   potassium chloride  40 mEq Oral BID   predniSONE  40 mg Oral Q breakfast   sodium chloride flush  3 mL Intravenous Q12H   sodium chloride flush  3 mL Intravenous Q12H   spironolactone  12.5 mg Oral Daily   thiamine  100 mg Oral Daily   Or   thiamine  100 mg Intravenous Daily    Infusions:  sodium chloride     sodium chloride     sodium chloride 10 mL/hr at 04/10/23 0613    PRN Medications: sodium  chloride, sodium chloride, hydrALAZINE, ondansetron **OR** ondansetron (ZOFRAN) IV, oxyCODONE, sodium chloride flush, sodium chloride flush   Assessment/Plan   1. Acute diastolic CHF: Patient presented with volume overload. Echo showed EF 60-65%, moderate LVH, mildly dilated RV with normal systolic function, PA pressure does not appear elevated, IVC not dilated. He has history of HTN but was not taking meds at home. Presentation with volume overload and NYHA class III symptoms.  Interestingly, IVC was not significantly dilated on echo. ?High output heart failure related to chronic severe anemia.  Also consider pulmonary hypertension related to liver disease though not cirrhotic by imaging or simply diastolic CHF with LVH and HTN.  He has diuresed well again over the last day.   - Think we can stop IV Lasix today and transition to po, will decide on dose after RHC.  - Continue Farxiga.  - Continue spironolactone 12.5 mg daily.  - RHC today to more accurately assess filling pressures, PA pressure, and CO (?high output HF). I discussed risks/benefits with patient and he agrees to procedure.  2. Gout: Gouty flare in left elbow, seems relatively mild and improved today.  - Would avoid NSAIDs with CHF.  - Continue colchicine 0.6 daily.  - Can use course of prednisone if needed.  3. Anemia: Hgb 5.8 at admission, required 2 units PRBCs.  Carries history of B12 deficiency (takes at home, level ok this admission) and beta thalassemia minor.  Beta thal minor generally causes only mild anemia.  No splenomegaly noted on CT abdomen. FOBT negative, no overt bleeding.  Hgb 7.9 after 2 units PRBCs.   - CBC today.  - Think he will need outpatient hematology workup.  - Tbili remains elevated at 2.2 but improved with diuresis, LDH mildly elevated, and pending haptoglobin as part of hemolysis evaluation.   4. Hepatitis:  Heavy ETOH, slight ascites noted on CT. INR mildly elevated and albumen low, suggesting impaired  liver synthetic function. RUQ [place] suggestive of hepatic steatosis but not cirrhosis.  CT abdomen not suggestive of cirrhosis and no splenomegaly.  Viral hepatitic workup negative.  Tbili and AST elevated at admission.  ?ETOH hepatitis.   - Needs to quit ETOH (discussed).  5. ETOH abuse: >12 drinks/day for a long time.  Possible ETOH hepatitis as above.  He has not had any ETOH x 5 days and no sign of severe withdrawal.   - Recommended cessation and consideration of treatment program.  6. HTN: BP appears controlled on current regimen.   Length of Stay: 4  Tokiko Diefenderfer, MD  04/10/2023, 9:17 AM  Advanced   Heart Failure Team Pager [phone] (M-F; 7a - 5p)  Please contact [facility] for night-coverage after hours (5p -7a ) and weekends on [email or web address]   

## 2023-04-10 NOTE — Progress Notes (Signed)
Patient ID: Shawn Wolfe, male   DOB: 16-Aug-1976, 47 y.o.   MRN: [id number]     Advanced Heart Failure Rounding Note  PCP-Cardiologist: None   Subjective:    Breathing is better.  Excellent diuresis yesterday. I/Os net negative 3110.  Creatinine stable 0.73.     Objective:   Weight Range: 83.9 kg Body mass index is 29.86 kg/m.   Vital Signs:   Temp:  [98.1 F (36.7 C)-99.6 F (37.6 C)] 98.6 F (37 C) (04/16 0832) Pulse Rate:  [75-91] 79 (04/16 0832) Resp:  [16-20] 16 (04/16 0832) BP: (107-131)/(70-85) 121/80 (04/16 0832) SpO2:  [89 %-98 %] 97 % (04/16 0832) Last BM Date : 04/09/23  Weight change: Filed Weights   04/06/23 1244  Weight: 83.9 kg    Intake/Output:   Intake/Output Summary (Last 24 hours) at 04/10/2023 0917 Last data filed at 04/10/2023 4098 Gross per 24 hour  Intake 240 ml  Output 3350 ml  Net -3110 ml      Physical Exam    General:  Well appearing. No resp difficulty HEENT: Normal Neck: Supple. JVP not elevated. Carotids 2+ bilat; no bruits. No lymphadenopathy or thyromegaly appreciated. Cor: PMI nondisplaced. Regular rate & rhythm. No rubs, gallops or murmurs. Lungs: Clear Abdomen: Soft, nontender, nondistended. No hepatosplenomegaly. No bruits or masses. Good bowel sounds. Extremities: No cyanosis, clubbing, rash. Trace ankle edema.  Neuro: Alert & orientedx3, cranial nerves grossly intact. moves all 4 extremities w/o difficulty. Affect pleasant   Telemetry   NSR, personally reviewed   Labs    CBC Recent Labs    04/08/23 0515 04/09/23 0450  WBC 5.3 5.5  HGB 8.0* 7.9*  HCT 26.2* 26.0*  MCV 88.5 88.7  PLT 130* 140*   Basic Metabolic Panel Recent Labs    11/91/47 0515 04/09/23 0450 04/10/23 0458  NA 134* 136 135  K 3.3* 4.2 4.4  CL 93* 97* 99  CO2 GLUCOSE 79 93 112*  BUN 6 <5* 7  CREATININE 0.66 0.68 0.73  CALCIUM 7.7* 8.0* 8.3*  MG 1.7 1.6*  --   PHOS 2.9 3.6  --    Liver Function Tests Recent Labs     04/10/23 0458  AST 38  ALT 32  ALKPHOS 35*  BILITOT 2.2*  PROT 6.4*  ALBUMIN 2.9*   No results for input(s): "LIPASE", "AMYLASE" in the last 72 hours. Cardiac Enzymes No results for input(s): "CKTOTAL", "CKMB", "CKMBINDEX", "TROPONINI" in the last 72 hours.  BNP: BNP (last 3 results) Recent Labs    04/06/23 1407  BNP 599.6*    ProBNP (last 3 results) No results for input(s): "PROBNP" in the last 8760 hours.   D-Dimer No results for input(s): "DDIMER" in the last 72 hours. Hemoglobin A1C No results for input(s): "HGBA1C" in the last 72 hours. Fasting Lipid Panel No results for input(s): "CHOL", "HDL", "LDLCALC", "TRIG", "CHOLHDL", "LDLDIRECT" in the last 72 hours. Thyroid Function Tests No results for input(s): "TSH", "T4TOTAL", "T3FREE", "THYROIDAB" in the last 72 hours.  Invalid input(s): "FREET3"  Other results:   Imaging    No results found.   Medications:     Scheduled Medications:  sodium chloride   Intravenous Once   sodium chloride   Intravenous Once   sodium chloride   Intravenous Once   colchicine  0.6 mg Oral Daily   dapagliflozin propanediol  10 mg Oral Daily   enoxaparin (LOVENOX) injection  40 mg Subcutaneous Q24H   folic acid  1 mg  Oral Daily   furosemide  80 mg Intravenous BID   lidocaine  1 patch Transdermal Q24H   lidocaine  1 patch Transdermal Q24H   lidocaine  1 patch Transdermal Q24H   losartan  12.5 mg Oral Daily   multivitamin with minerals  1 tablet Oral Daily   pantoprazole (PROTONIX) IV  40 mg Intravenous Q12H   potassium chloride  40 mEq Oral BID   predniSONE  40 mg Oral Q breakfast   sodium chloride flush  3 mL Intravenous Q12H   sodium chloride flush  3 mL Intravenous Q12H   spironolactone  12.5 mg Oral Daily   thiamine  100 mg Oral Daily   Or   thiamine  100 mg Intravenous Daily    Infusions:  sodium chloride     sodium chloride     sodium chloride 10 mL/hr at 04/10/23 0613    PRN Medications: sodium  chloride, sodium chloride, hydrALAZINE, ondansetron **OR** ondansetron (ZOFRAN) IV, oxyCODONE, sodium chloride flush, sodium chloride flush   Assessment/Plan   1. Acute diastolic CHF: Patient presented with volume overload. Echo showed EF 60-65%, moderate LVH, mildly dilated RV with normal systolic function, PA pressure does not appear elevated, IVC not dilated. He has history of HTN but was not taking meds at home. Presentation with volume overload and NYHA class III symptoms.  Interestingly, IVC was not significantly dilated on echo. ?High output heart failure related to chronic severe anemia.  Also consider pulmonary hypertension related to liver disease though not cirrhotic by imaging or simply diastolic CHF with LVH and HTN.  He has diuresed well again over the last day.   - Think we can stop IV Lasix today and transition to po, will decide on dose after RHC.  - Continue Farxiga.  - Continue spironolactone 12.5 mg daily.  - RHC today to more accurately assess filling pressures, PA pressure, and CO (?high output HF). I discussed risks/benefits with patient and he agrees to procedure.  2. Gout: Gouty flare in left elbow, seems relatively mild and improved today.  - Would avoid NSAIDs with CHF.  - Continue colchicine 0.6 daily.  - Can use course of prednisone if needed.  3. Anemia: Hgb 5.8 at admission, required 2 units PRBCs.  Carries history of B12 deficiency (takes at home, level ok this admission) and beta thalassemia minor.  Beta thal minor generally causes only mild anemia.  No splenomegaly noted on CT abdomen. FOBT negative, no overt bleeding.  Hgb 7.9 after 2 units PRBCs.   - CBC today.  - Think he will need outpatient hematology workup.  - Tbili remains elevated at 2.2 but improved with diuresis, LDH mildly elevated, and pending haptoglobin as part of hemolysis evaluation.   4. Hepatitis:  Heavy ETOH, slight ascites noted on CT. INR mildly elevated and albumen low, suggesting impaired  liver synthetic function. RUQ [place] suggestive of hepatic steatosis but not cirrhosis.  CT abdomen not suggestive of cirrhosis and no splenomegaly.  Viral hepatitic workup negative.  Tbili and AST elevated at admission.  ?ETOH hepatitis.   - Needs to quit ETOH (discussed).  5. ETOH abuse: >12 drinks/day for a long time.  Possible ETOH hepatitis as above.  He has not had any ETOH x 5 days and no sign of severe withdrawal.   - Recommended cessation and consideration of treatment program.  6. HTN: BP appears controlled on current regimen.   Length of Stay: 4  Marca Ancona, MD  04/10/2023, 9:17 AM  Advanced  Heart Failure Team Pager [phone] (M-F; 7a - 5p)  Please contact [facility] for night-coverage after hours (5p -7a ) and weekends on [email or web address]

## 2023-04-11 ENCOUNTER — Encounter: Payer: Self-pay | Admitting: Cardiology

## 2023-04-11 LAB — POCT I-STAT EG7
Acid-Base Excess: 2 mmol/L (ref 0.0–2.0)
Bicarbonate: 26.3 mmol/L (ref 20.0–28.0)
Calcium, Ion: 1.2 mmol/L (ref 1.15–1.40)
HCT: 29 % — ABNORMAL LOW (ref 39.0–52.0)
Hemoglobin: 9.9 g/dL — ABNORMAL LOW (ref 13.0–17.0)
O2 Saturation: 71 %
Potassium: 4.7 mmol/L (ref 3.5–5.1)
Sodium: 135 mmol/L (ref 135–145)
TCO2: 27 mmol/L (ref 22–32)
pCO2, Ven: 38.7 mmHg — ABNORMAL LOW (ref 44–60)
pH, Ven: 7.441 — ABNORMAL HIGH (ref 7.25–7.43)
pO2, Ven: 36 mmHg (ref 32–45)

## 2023-04-11 LAB — CULTURE, BLOOD (SINGLE): Culture: NO GROWTH

## 2023-04-11 LAB — HAPTOGLOBIN: Haptoglobin: 217 mg/dL (ref 23–355)

## 2023-04-11 NOTE — Telephone Encounter (Signed)
Patient Advocate Encounter  Received notification that the request for prior authorization for Farxiga  tablets  has been denied due to no clinical notes sent.     This determination is currently being appealed I have sent in Clinical Notes.  Roland Earl, CPhT Pharmacy Patient Advocate Specialist [facility] Patient Advocate Team Direct Number: [phone]  Fax: [phone]

## 2023-04-11 NOTE — Telephone Encounter (Signed)
Patient Advocate Encounter  Prior Authorization for Shawn Wolfe  tablets  has been approved.    PA# [id number] Insurance [employer] Electronic PA Form  Effective dates: 04/11/2023 through 04/10/2024     Roland Earl, CPhT Pharmacy Patient Advocate Specialist [facility] Patient Advocate Team Direct Number: [phone]  Fax: [phone]

## 2023-04-12 NOTE — Progress Notes (Signed)
I,Shawn Wolfe,acting as a scribe for [employer], MD.,have documented all relevant documentation on the behalf of Shawn Ramp, MD,as directed by  Shawn Ramp, MD while in the presence of Shawn Ramp, MD.   New patient visit   Patient: Shawn Wolfe   DOB: 1976-07-03   47 y.o. Male  MRN: [id number] Visit Date: 04/13/2023  Today's healthcare provider: [name], MD   Chief Complaint  Patient presents with   Establish Care   Subjective    Shawn Wolfe is a 47 y.o. male who presents today as a new patient to establish care.  HPI   Encounter to Establish Care Patient presents to establish care  Introduced myself and my role as primary care physician  We reviewed patient's medical, surgical, and social history and medications as listed below   PMHX   Last Physical: several  years ago   Gout  Medications: allopurinol 300mg , short script prednisone 20mg    Last episode resolved Started in great toe  Now involves any joint including thumbs, ankles, knees and chest   Alcohol Use d/o Medications: none  -reports going 13 days without drinking alcohol -states never had DT -reports chronic hand tremor  -swelling episodes occur every so many years   HTN Amlodipine 5mg , losartan 25mg , spironolactone 12.5mg   States that his BP is normally well controlled  Reports adherence to medications   Social Hx  Tobacco use: chewing tobacco  Alcohol Use : 13  Illicit drug use: denies current or previous   Here with girlfriend, Shawn Wolfe,  of 23 years; she struggles w/ epilepsy, declining memory  Has not worked in 5 months  Worked in [employer] 30+ years Reports fam hx of alcohol use  Reports two DWI  Does not currently have license, taken in his 36s but has not tried to get them back   Was hospitalized 04/06/23  Past Medical History:  Diagnosis Date   Abnormal LFTs    Alcohol abuse    Allergy     Arthritis    B12 deficiency    Blood transfusion without reported diagnosis    Chronic kidney disease    Gout    Heart murmur    History of echocardiogram    a. 09/2021 Echo: EF 60-65%, no rwma, nl RV fxn, triv MR.   Hypertension    Iron deficiency anemia    Sleep apnea    Thalassemia minor    Past Surgical History:  Procedure Laterality Date   HAND SURGERY Right    mutiple fractures requiring ORIF - pins later removed.   RIGHT HEART CATH N/A 04/10/2023   Procedure: RIGHT HEART CATH;  Surgeon: Laurey Morale, MD;  Location: [facility] INVASIVE CV LAB;  Service: Cardiovascular;  Laterality: N/A;   Family Status  Relation Name Status   Mother  Deceased   Father  Deceased   Sister  (Not Specified)   Brother  (Not Specified)   Family History  Problem Relation Age of Onset   Cancer Mother    Hypertension Mother    Liver disease Mother    Cancer Father    Kidney disease Father    Cancer Sister    Hypertension Brother    Social History   Socioeconomic History   Marital status: Single    Spouse name: Not on file   Number of children: Not on file   Years of education: Not on file   Highest education level: Not on file  Occupational History   Not  on file  Tobacco Use   Smoking status: Never   Smokeless tobacco: Current    Types: Chew  Vaping Use   Vaping Use: Never used  Substance and Sexual Activity   Alcohol use: Yes    Alcohol/week: 13.0 standard drinks of alcohol    Types: 13 Cans of beer per week    Comment: at least 12 cans of mixed cocktail/malt beverage daily.   Drug use: Never   Sexual activity: Yes  Other Topics Concern   Not on file  Social History Narrative   Lives in [place], [place] w/ girlfriend.  Does not routinely exercise.     Social Determinants of Health   Financial Resource Strain: Not on file  Food Insecurity: Not on file  Transportation Needs: Not on file  Physical Activity: Not on file  Stress: Not on file  Social Connections: Not on file    Outpatient Medications Prior to Visit  Medication Sig   colchicine 0.6 MG tablet Take one tablet BID until gout is gone. Thereafter, can take as needed for gout flare: take 1.2 mg x1, followed by 0.6mg  x1.   dapagliflozin propanediol (FARXIGA) 10 MG TABS tablet Take 1 tablet (10 mg total) by mouth daily.   losartan (COZAAR) 25 MG tablet Take 0.5 tablets (12.5 mg total) by mouth daily.   Multiple Vitamin (MULTIVITAMIN WITH MINERALS) TABS tablet Take 1 tablet by mouth daily.   predniSONE (DELTASONE) 20 MG tablet Take 2 tablets (40 mg total) by mouth daily with breakfast for 3 days.   spironolactone (ALDACTONE) 25 MG tablet Take 0.5 tablets (12.5 mg total) by mouth daily.   No facility-administered medications prior to visit.   Allergies  Allergen Reactions   Sulfa Antibiotics Hives   Latex Rash    Immunization History  Administered Date(s) Administered   Influenza,inj,Quad PF,6+ Mos 10/11/2021   PFIZER Comirnaty(Gray Top)Covid-19 Tri-Sucrose Vaccine 10/11/2021   Td 02/26/2023    Health Maintenance  Topic Date Due   COLONOSCOPY (Pts 45-65yrs Insurance coverage will need to be confirmed)  Never done   COVID-19 Vaccine (2 - 2023-24 season) 08/25/2022   INFLUENZA VACCINE  07/26/2023   DTaP/Tdap/Td (2 - Tdap) 02/25/2033   Hepatitis C Screening  Completed   HIV Screening  Completed   HPV VACCINES  Aged Out    Patient Care Team: Shawn Ramp, MD as PCP - General (Family Medicine)  Review of Systems  Constitutional:  Positive for activity change, fatigue and unexpected weight change.  HENT:  Positive for congestion and rhinorrhea.   Respiratory:  Positive for cough and shortness of breath.   Cardiovascular:  Positive for leg swelling.  Gastrointestinal:  Positive for abdominal distention, constipation, diarrhea and vomiting.  Genitourinary:  Positive for difficulty urinating and dysuria.  Musculoskeletal:  Positive for arthralgias and joint swelling.  Skin:   Positive for color change.       Objective    BP 122/76 (BP Location: Right Arm, Patient Position: Sitting, Cuff Size: Normal)   Pulse 96   Temp 98.6 F (37 C) (Oral)   Resp 16   Ht  (1.676 m)   Wt 187 lb (84.8 kg)   SpO2 99%   BMI 30.18 kg/m    Physical Exam Vitals reviewed.  Constitutional:      General: He is not in acute distress.    Appearance: Normal appearance. He is not ill-appearing, toxic-appearing or diaphoretic.  HENT:     Mouth/Throat:     Mouth: Mucous membranes are  moist.     Pharynx: No oropharyngeal exudate or posterior oropharyngeal erythema.  Eyes:     Comments: Mild scleral icterus   Cardiovascular:     Rate and Rhythm: Normal rate and regular rhythm.     Pulses: Normal pulses.     Heart sounds: Normal heart sounds. No murmur heard.    No friction rub. No gallop.  Pulmonary:     Effort: Pulmonary effort is normal. No respiratory distress.     Breath sounds: Normal breath sounds. No stridor. No wheezing, rhonchi or rales.  Abdominal:     General: Bowel sounds are normal. There is no distension.     Palpations: Abdomen is soft. There is no hepatomegaly or mass.     Tenderness: There is no abdominal tenderness.  Musculoskeletal:        General: Deformity present.     Right lower leg: Edema present.     Left lower leg: Edema present.     Comments: Lateral legs bilaterally had areas of indention without erythema or laceration, no tenderness  Skin:    Findings: No erythema or rash.  Neurological:     Mental Status: He is alert and oriented to person, place, and time.      Depression Screen    04/13/2023    2:29 PM  PHQ 2/9 Scores  PHQ - 2 Score 6  PHQ- 9 Score 15   No results found for any visits on 04/13/23.  Assessment & Plan      Problem List Items Addressed This Visit       Cardiovascular and Mediastinum   Benign essential HTN    Controlled BP at goal Continue current medications at current doses spironolactone 12.5mg   daily, losartan 25mg  daily   No medications changes today          Other   Gout    Chronic  Encouraged patient to avoid alcohol as this increases gout flare risk Patient will continue allopurinol 300mg  daily  Denies current symptoms today Will complete prednisone course 04/14/23       Bilateral leg edema    Intermittent problem  Advised patient that heavy alcohol drinking will contribute to this as it can affect both the heart and liver  Recommended that he work towards abstaining from alcohol by cutting back his intake  Patient states he will not take lasix again after urinary frequency while in the hospital  This problem is improved today  Will monitor clinically       Alcohol use disorder, severe, dependence - Primary    Chronic  Reports drinking 13 beers daily, reports that he enjoys drinking alcohol  Patient not currently licensed to drive reports 2 DWIs  Will start naltrexone 50mg  daily  Patient reports he is self motivated and is not ready to consider rehab He is open to speaking with psychiatry or a therapist regarding his addiction  Reviewed CMP from 04/10/23 prior to starting medication, AST and ALT within normal limits Patient will follow up with PCP 1 time per month for alcohol use disorder  Prescribed folvite 1mg  daily         Relevant Orders   Ambulatory referral to Psychiatry   Establishing care with new doctor, encounter for    Welcomed patient to [facility]  Reviewed patient's medical history, medications, surgical and social history Discussed roles and expectations for primary care physician-patient relationship Recommended patient schedule annual preventative examinations          Return  in about 1 month (around 05/13/2023) for alcohol use d/o, HTN.       The entirety of the information documented in the History of Present Illness, Review of Systems and Physical Exam were personally obtained by me. Portions of this information  were initially documented by Hetty Ely, CMA . I, Shawn Ramp, MD have reviewed the documentation above for thoroughness and accuracy.      Shawn Ramp, MD  [facility] [phone] (phone) [phone] (fax)  [facility]

## 2023-04-13 ENCOUNTER — Ambulatory Visit: Payer: Medicaid Other | Admitting: Family Medicine

## 2023-04-13 ENCOUNTER — Encounter: Payer: Self-pay | Admitting: Family Medicine

## 2023-04-13 VITALS — BP 122/76 | HR 96 | Temp 98.6°F | Resp 16 | Ht 66.0 in | Wt 187.0 lb

## 2023-04-13 DIAGNOSIS — Z7689 Persons encountering health services in other specified circumstances: Secondary | ICD-10-CM | POA: Insufficient documentation

## 2023-04-13 DIAGNOSIS — F1020 Alcohol dependence, uncomplicated: Secondary | ICD-10-CM | POA: Insufficient documentation

## 2023-04-13 DIAGNOSIS — M1A49X0 Other secondary chronic gout, multiple sites, without tophus (tophi): Secondary | ICD-10-CM | POA: Diagnosis not present

## 2023-04-13 DIAGNOSIS — I10 Essential (primary) hypertension: Secondary | ICD-10-CM

## 2023-04-13 DIAGNOSIS — R600 Localized edema: Secondary | ICD-10-CM | POA: Diagnosis not present

## 2023-04-13 MED ORDER — FOLIC ACID 1 MG PO TABS
1.0000 mg | ORAL_TABLET | Freq: Every day | ORAL | 3 refills | Status: DC
Start: 2023-04-13 — End: 2023-10-03

## 2023-04-13 MED ORDER — NALTREXONE HCL 50 MG PO TABS
50.0000 mg | ORAL_TABLET | Freq: Every day | ORAL | Status: DC
Start: 2023-04-13 — End: 2023-06-11

## 2023-04-13 NOTE — Assessment & Plan Note (Signed)
Welcomed patient to [facility]  Reviewed patient's medical history, medications, surgical and social history Discussed roles and expectations for primary care physician-patient relationship Recommended patient schedule annual preventative examinations   

## 2023-04-13 NOTE — Assessment & Plan Note (Signed)
Controlled BP at goal Continue current medications at current doses spironolactone 12.5mg  daily, losartan  daily   No medications changes today

## 2023-04-13 NOTE — Patient Instructions (Signed)
It was a pleasure meeting you today!  Welcome to [facility].  I look forward to taking part in your care as your new primary care physician.    Summary of our discussion today:   I have prescribed Naltrexone to help with reducing alcohol consumption. Please take (1)  tablet daily A referral has been placed on your behalf. Our referral coordination team or the office you will be visiting will contact you within the next 2 weeks. If you have not received a phone call within 10 business days please let us know so that we can check into this for you.  I have also sent a prescription for folic acid. Please take this along with Vitamin B1 (Thiamine) to protect your neurological system   Please remember to schedule your annual physical one year from your last physical.   You should return to our clinic in 1 month for regular check in on alcohol use and blood pressure.   Best Wishes,   Dr. Roxan Hockey

## 2023-04-13 NOTE — Assessment & Plan Note (Addendum)
Chronic  Reports drinking 13 beers daily, reports that he enjoys drinking alcohol  Patient not currently licensed to drive reports 2 DWIs  Will start naltrexone  daily  Patient reports he is self motivated and is not ready to consider rehab He is open to speaking with psychiatry or a therapist regarding his addiction  Reviewed CMP from 04/10/23 prior to starting medication, AST and ALT within normal limits Patient will follow up with PCP 1 time per month for alcohol use disorder  Prescribed folvite  daily

## 2023-04-13 NOTE — Assessment & Plan Note (Signed)
Intermittent problem  Advised patient that heavy alcohol drinking will contribute to this as it can affect both the heart and liver  Recommended that he work towards abstaining from alcohol by cutting back his intake  Patient states he will not take lasix again after urinary frequency while in the hospital  This problem is improved today  Will monitor clinically

## 2023-04-13 NOTE — Assessment & Plan Note (Addendum)
Chronic  Encouraged patient to avoid alcohol as this increases gout flare risk Patient will continue allopurinol  daily  Denies current symptoms today Will complete prednisone course 04/14/23

## 2023-04-20 ENCOUNTER — Encounter: Payer: Medicaid Other | Admitting: Internal Medicine

## 2023-04-25 DIAGNOSIS — Z419 Encounter for procedure for purposes other than remedying health state, unspecified: Secondary | ICD-10-CM | POA: Diagnosis not present

## 2023-05-14 ENCOUNTER — Ambulatory Visit: Payer: Medicaid Other | Admitting: Family Medicine

## 2023-05-16 NOTE — Progress Notes (Deleted)
      Established patient visit   Patient: Shawn Wolfe   DOB: [birth year]   47 y.o. Male  MRN: [id number] Visit Date: 05/17/2023  Today's healthcare provider: [name], MD   No chief complaint on file.  Subjective    HPI   Hypertension: Patient here for 1 month follow-up of elevated blood pressure. Management from last office visit includes continue current medications at current doses spironolactone 12.5mg  daily, losartan 25mg  daily.Blood pressure {is/is not:9024} well controlled at home. Cardiac symptoms {Symptoms; cardiac:12860}. Patient denies {Symptoms; cardiac:12860}.  Cardiovascular risk factors: hypertension, male gender, and sedentary lifestyle.   BP Readings from Last 3 Encounters:  04/13/23 122/76  04/10/23 134/84  02/26/23 (!) 159/95   Follow up for alcohol use d/o:  The patient was last seen for this 1 months ago. Changes made at last visit include ***. Prescribed folvite 1mg  daily .  Audit-C Alcohol Use Screening      04/13/2023    2:28 PM  Alcohol Use Disorder Test (AUDIT)  1. How often do you have a drink containing alcohol? 4  2. How many drinks containing alcohol do you have on a typical day when you are drinking? 4  3. How often do you have six or more drinks on one occasion? 4  AUDIT-C Score 12    A score of 3 or more in women, and 4 or more in men indicates increased risk for alcohol abuse, EXCEPT if all of the points are from question 1   Medications: Outpatient Medications Prior to Visit  Medication Sig   colchicine 0.6 MG tablet Take one tablet BID until gout is gone. Thereafter, can take as needed for gout flare: take 1.2 mg x1, followed by 0.6mg  x1.   dapagliflozin propanediol (FARXIGA) 10 MG TABS tablet Take 1 tablet (10 mg total) by mouth daily.   folic acid (FOLVITE) 1 MG tablet Take 1 tablet (1 mg total) by mouth daily.   losartan (COZAAR) 25 MG tablet Take 0.5 tablets (12.5 mg total) by mouth daily.   Multiple Vitamin  (MULTIVITAMIN WITH MINERALS) TABS tablet Take 1 tablet by mouth daily.   naltrexone (DEPADE) 50 MG tablet Take 1 tablet (50 mg total) by mouth daily.   spironolactone (ALDACTONE) 25 MG tablet Take 0.5 tablets (12.5 mg total) by mouth daily.   No facility-administered medications prior to visit.    Review of Systems  {Labs  Heme  Chem  Endocrine  Serology  Results Review (optional):23779}   Objective    There were no vitals taken for this visit. {Show previous vital signs (optional):23777}  Physical Exam  ***  No results found for any visits on 05/17/23.  Assessment & Plan     ***  No follow-ups on file.      {provider attestation***:1}   [name], MD  [facility] [phone] (phone) [phone] (fax)  [facility]

## 2023-05-17 ENCOUNTER — Ambulatory Visit: Payer: Medicaid Other | Admitting: Family Medicine

## 2023-05-26 DIAGNOSIS — Z419 Encounter for procedure for purposes other than remedying health state, unspecified: Secondary | ICD-10-CM | POA: Diagnosis not present

## 2023-06-07 ENCOUNTER — Emergency Department: Payer: Medicaid Other

## 2023-06-07 ENCOUNTER — Emergency Department
Admission: EM | Admit: 2023-06-07 | Discharge: 2023-06-07 | Disposition: A | Discharge status: Home / Self Care | Payer: Medicaid Other | Attending: Emergency Medicine | Admitting: Emergency Medicine

## 2023-06-07 ENCOUNTER — Other Ambulatory Visit: Payer: Self-pay

## 2023-06-07 DIAGNOSIS — R1011 Right upper quadrant pain: Secondary | ICD-10-CM | POA: Insufficient documentation

## 2023-06-07 DIAGNOSIS — K824 Cholesterolosis of gallbladder: Secondary | ICD-10-CM | POA: Diagnosis not present

## 2023-06-07 DIAGNOSIS — R112 Nausea with vomiting, unspecified: Secondary | ICD-10-CM | POA: Insufficient documentation

## 2023-06-07 DIAGNOSIS — K760 Fatty (change of) liver, not elsewhere classified: Secondary | ICD-10-CM | POA: Diagnosis not present

## 2023-06-07 DIAGNOSIS — K7010 Alcoholic hepatitis without ascites: Secondary | ICD-10-CM

## 2023-06-07 DIAGNOSIS — N189 Chronic kidney disease, unspecified: Secondary | ICD-10-CM | POA: Diagnosis not present

## 2023-06-07 DIAGNOSIS — R1013 Epigastric pain: Secondary | ICD-10-CM | POA: Insufficient documentation

## 2023-06-07 DIAGNOSIS — R945 Abnormal results of liver function studies: Secondary | ICD-10-CM | POA: Diagnosis not present

## 2023-06-07 DIAGNOSIS — I130 Hypertensive heart and chronic kidney disease with heart failure and stage 1 through stage 4 chronic kidney disease, or unspecified chronic kidney disease: Secondary | ICD-10-CM | POA: Insufficient documentation

## 2023-06-07 DIAGNOSIS — R7401 Elevation of levels of liver transaminase levels: Secondary | ICD-10-CM

## 2023-06-07 DIAGNOSIS — E876 Hypokalemia: Secondary | ICD-10-CM | POA: Diagnosis not present

## 2023-06-07 DIAGNOSIS — I5030 Unspecified diastolic (congestive) heart failure: Secondary | ICD-10-CM | POA: Insufficient documentation

## 2023-06-07 DIAGNOSIS — I700 Atherosclerosis of aorta: Secondary | ICD-10-CM | POA: Diagnosis not present

## 2023-06-07 DIAGNOSIS — R1032 Left lower quadrant pain: Secondary | ICD-10-CM | POA: Diagnosis not present

## 2023-06-07 DIAGNOSIS — R109 Unspecified abdominal pain: Secondary | ICD-10-CM | POA: Diagnosis not present

## 2023-06-07 LAB — BASIC METABOLIC PANEL
Anion gap: 15 (ref 5–15)
BUN: 8 mg/dL (ref 6–20)
CO2: 25 mmol/L (ref 22–32)
Calcium: 9.3 mg/dL (ref 8.9–10.3)
Chloride: 97 mmol/L — ABNORMAL LOW (ref 98–111)
Creatinine, Ser: 0.63 mg/dL (ref 0.61–1.24)
GFR, Estimated: >60 mL/min (ref >60)
Glucose, Bld: 116 mg/dL — ABNORMAL HIGH (ref 70–99)
Potassium: 2.8 mmol/L — ABNORMAL LOW (ref 3.5–5.1)
Sodium: 137 mmol/L (ref 135–145)

## 2023-06-07 LAB — HEPATIC FUNCTION PANEL
ALT: 240 U/L — ABNORMAL HIGH (ref 0–44)
AST: 584 U/L — ABNORMAL HIGH (ref 15–41)
Albumin: 4.1 g/dL (ref 3.5–5.0)
Alkaline Phosphatase: 82 U/L (ref 38–126)
Bilirubin, Direct: 1.3 mg/dL — ABNORMAL HIGH (ref 0.0–0.2)
Indirect Bilirubin: 6.3 mg/dL — ABNORMAL HIGH (ref 0.3–0.9)
Total Bilirubin: 7.6 mg/dL — ABNORMAL HIGH (ref 0.3–1.2)
Total Protein: 7.4 g/dL (ref 6.5–8.1)

## 2023-06-07 LAB — URINALYSIS, ROUTINE W REFLEX MICROSCOPIC
Bilirubin Urine: NEGATIVE
Glucose, UA: NEGATIVE mg/dL
Hgb urine dipstick: NEGATIVE
Ketones, ur: 20 mg/dL — AB
Leukocytes,Ua: NEGATIVE
Nitrite: NEGATIVE
Protein, ur: NEGATIVE mg/dL
Specific Gravity, Urine: >1.046 — ABNORMAL HIGH (ref 1.005–1.030)
pH: 7 (ref 5.0–8.0)

## 2023-06-07 LAB — CBC WITH DIFFERENTIAL/PLATELET
Abs Immature Granulocytes: 0.01 10*3/uL (ref 0.00–0.07)
Basophils Absolute: 0 10*3/uL (ref 0.0–0.1)
Basophils Relative: 1 %
Eosinophils Absolute: 0 10*3/uL (ref 0.0–0.5)
Eosinophils Relative: 1 %
HCT: 34.7 % — ABNORMAL LOW (ref 39.0–52.0)
Hemoglobin: 11.1 g/dL — ABNORMAL LOW (ref 13.0–17.0)
Immature Granulocytes: 0 %
Lymphocytes Relative: 15 %
Lymphs Abs: 0.5 10*3/uL — ABNORMAL LOW (ref 0.7–4.0)
MCH: 23.5 pg — ABNORMAL LOW (ref 26.0–34.0)
MCHC: 32 g/dL (ref 30.0–36.0)
MCV: 73.4 fL — ABNORMAL LOW (ref 80.0–100.0)
Monocytes Absolute: 0.2 10*3/uL (ref 0.1–1.0)
Monocytes Relative: 6 %
Neutro Abs: 2.8 10*3/uL (ref 1.7–7.7)
Neutrophils Relative %: 77 %
Platelets: 101 10*3/uL — ABNORMAL LOW (ref 150–400)
RBC: 4.73 MIL/uL (ref 4.22–5.81)
RDW: 18.5 % — ABNORMAL HIGH (ref 11.5–15.5)
WBC: 3.6 10*3/uL — ABNORMAL LOW (ref 4.0–10.5)
nRBC: 0.6 % — ABNORMAL HIGH (ref 0.0–0.2)

## 2023-06-07 LAB — LIPASE, BLOOD: Lipase: 33 U/L (ref 11–51)

## 2023-06-07 LAB — LACTIC ACID, PLASMA
Lactic Acid, Venous: 1.1 mmol/L (ref 0.5–1.9)
Lactic Acid, Venous: 0.9 mmol/L (ref 0.5–1.9)

## 2023-06-07 LAB — MAGNESIUM: Magnesium: 1.6 mg/dL — ABNORMAL LOW (ref 1.7–2.4)

## 2023-06-07 MED ORDER — MAGNESIUM SULFATE 2 GM/50ML IV SOLN
2.0000 g | Freq: Once | INTRAVENOUS | Status: AC
  Administered 2023-06-07: 2 g via INTRAVENOUS
  Filled 2023-06-07: qty 50

## 2023-06-07 MED ORDER — ONDANSETRON HCL 4 MG PO TABS: 4.0000 mg | ORAL_TABLET | Freq: Every day | ORAL | 1 refills | Status: AC | PRN

## 2023-06-07 MED ORDER — IOHEXOL 300 MG/ML  SOLN
100.0000 mL | Freq: Once | INTRAMUSCULAR | Status: AC | PRN
  Administered 2023-06-07: 100 mL via INTRAVENOUS

## 2023-06-07 MED ORDER — POTASSIUM CHLORIDE CRYS ER 20 MEQ PO TBCR
40.0000 meq | EXTENDED_RELEASE_TABLET | Freq: Once | ORAL | Status: AC
  Administered 2023-06-07: 40 meq via ORAL
  Filled 2023-06-07: qty 2

## 2023-06-07 MED ORDER — ACETAMINOPHEN 325 MG PO TABS
650.0000 mg | ORAL_TABLET | Freq: Once | ORAL | Status: AC
  Administered 2023-06-07: 650 mg via ORAL
  Filled 2023-06-07: qty 2

## 2023-06-07 MED ORDER — THIAMINE HCL 100 MG/ML IJ SOLN
100.0000 mg | Freq: Once | INTRAMUSCULAR | Status: AC
  Administered 2023-06-07: 100 mg via INTRAVENOUS
  Filled 2023-06-07: qty 2

## 2023-06-07 MED ORDER — ONDANSETRON HCL 4 MG/2ML IJ SOLN
4.0000 mg | Freq: Once | INTRAMUSCULAR | Status: AC
  Administered 2023-06-07: 4 mg via INTRAVENOUS
  Filled 2023-06-07: qty 2

## 2023-06-07 MED ORDER — POTASSIUM CHLORIDE 10 MEQ/100ML IV SOLN
10.0000 meq | INTRAVENOUS | Status: AC
  Administered 2023-06-07 (×2): 10 meq via INTRAVENOUS
  Filled 2023-06-07 (×2): qty 100

## 2023-06-07 MED ORDER — LACTATED RINGERS IV BOLUS
1000.0000 mL | Freq: Once | INTRAVENOUS | Status: AC
  Administered 2023-06-07: 1000 mL via INTRAVENOUS

## 2023-06-07 MED ORDER — HYDROMORPHONE HCL 1 MG/ML IJ SOLN
0.5000 mg | Freq: Once | INTRAMUSCULAR | Status: AC
  Administered 2023-06-07: 0.5 mg via INTRAVENOUS
  Filled 2023-06-07: qty 0.5

## 2023-06-07 MED ORDER — IBUPROFEN 600 MG PO TABS
600.0000 mg | ORAL_TABLET | Freq: Once | ORAL | Status: AC
  Administered 2023-06-07: 600 mg via ORAL
  Filled 2023-06-07: qty 1

## 2023-06-07 NOTE — ED Provider Notes (Addendum)
Patient signed out to me pending workup.  LFTs and bilirubin elevated compared to prior, low potassium and magnesium, repletion ordered.  Pending CT.  The CT shows fatty liver, right upper quadrant ultrasound shows some gallbladder sludge otherwise normal with normal CBD.  Given his fever, elevated LFTs, elevated total bilirubin with fever and some mild scleral icterus considered cholangitis - I think this is less likely given the distribution of his pain is in the left lower and right lower quadrants, no leukocytosis, elevated bilirubin is indirect bili, transaminitis ratio was consistent with alcoholic hepatitis and no CBD dilation. Temp 98.5 now. I discussed with Dr. Allegra Lai of GI who agrees this is highly unlikely cholangitis.  Patient feels well now and would like to go home, symptoms well-controlled.  Plan for discharge with close PMD follow-up.  Return precautions given.      Pilar Jarvis, MD 06/07/23 1610    Pilar Jarvis, MD 06/07/23 [phone]    Pilar Jarvis, MD 06/07/23 [phone]

## 2023-06-07 NOTE — ED Triage Notes (Addendum)
Pt to ED via POV c/o abd pain for 3 days. Pt hasn't been able to keep anything down for 3 days. Pain to RLQ and LLQ, described as dull aching. Pt has hx of fatty liver disease. Reports having a fever earlier but did not take a temp. Denies CP, SOB, dizziness

## 2023-06-07 NOTE — ED Provider Notes (Signed)
[facility] Provider Note    Event Date/Time   First MD Initiated Contact with Patient 06/07/23 [phone]     (approximate)   History   Abdominal Pain   HPI  Glade Strausser is a 47 y.o. male past medical history of alcohol use disorder, gout, hypertension, diastolic heart failure, thalassemia minor who presents today because of abdominal pain and vomiting and abdominal bloating.  3 days ago patient developed vomiting and has not really been able to keep fluids down since.  For the last 2 days he has had bilateral lower quadrant tenderness left greater than right.  Says he has had less stool output but no diarrhea.  He woke up with fever today felt very sweaty.  His wife is concerned that he has jaundice.  Denies prior abdominal surgeries.  No blood in his stool black stool.  He is currently drinking about 2-3 beers per night but has not had anything to drink in about 5 days.  2 days prior to his symptoms starting he stopped drinking on purpose.  He had also run out of the medication and his doctor gave him to help cravings.  Has history of severe alcohol withdrawal including withdrawal seizures.     Past Medical History:  Diagnosis Date   Abnormal LFTs    Alcohol abuse    Allergy    Arthritis    B12 deficiency    Blood transfusion without reported diagnosis    Chronic kidney disease    Gout    Heart murmur    History of echocardiogram    a. 09/2021 Echo: EF 60-65%, no rwma, nl RV fxn, triv MR.   Hypertension    Iron deficiency anemia    Sleep apnea    Thalassemia minor     Patient Active Problem List   Diagnosis Date Noted   Alcohol use disorder, severe, dependence (HCC) 04/13/2023   Establishing care with new doctor, encounter for 04/13/2023   Cardiomegaly 04/07/2023   Transaminitis 04/07/2023   Anemia 04/07/2023   Anasarca 04/06/2023   Acute respiratory failure with hypoxia (HCC) 04/06/2023   Alcoholic liver disease (HCC) 04/06/2023    Thalassemia minor 10/11/2021   Elevated LFTs 10/10/2021   Polyarticular acute idiopathic gout 10/10/2021   Bilateral knee effusions 10/10/2021   Alcohol abuse 10/10/2021   B12 deficiency 10/10/2021   Gout 10/09/2021   Benign essential HTN 10/09/2021   Fever 10/09/2021   Hypokalemia 10/09/2021   Bilateral leg edema 10/09/2021   Iron deficiency anemia 10/09/2021   Knee pain, bilateral 10/09/2021   Hyperbilirubinemia 10/09/2021     Physical Exam  Triage Vital Signs: ED Triage Vitals  Enc Vitals Group     BP 06/07/23 0612 (!) 156/106     Pulse Rate 06/07/23 0612 (!) 111     Resp 06/07/23 0612 20     Temp 06/07/23 0612 (!) 100.4 F (38 C)     Temp Source 06/07/23 0612 Oral     SpO2 06/07/23 0612 96 %     Weight 06/07/23 0613 180 lb (81.6 kg)     Height 06/07/23 0613 5\' 6"  (1.676 m)     Head Circumference --      Peak Flow --      Pain Score 06/07/23 0613 7     Pain Loc --      Pain Edu? --      Excl. in GC? --     Most recent vital signs: Vitals:   06/07/23  0612  BP: (!) 156/106  Pulse: (!) 111  Resp: 20  Temp: (!) 100.4 F (38 C)  SpO2: 96%     General: Awake, no distress.  Mucous membranes are moist CV:  Good peripheral perfusion.  Tachycardic, trace pitting edema in the BL lower extremities Resp:  Normal effort.  Abd:  Mild distention, tenderness to palpation left lower quadrant with voluntary guarding mild epigastric and right upper quadrant tenderness with voluntary guarding Neuro:             Awake, Alert, Oriented x 3  Other:  Mild scleral icterus   ED Results / Procedures / Treatments  Labs (all labs ordered are listed, but only abnormal results are displayed) Labs Reviewed  LIPASE, BLOOD  URINALYSIS, ROUTINE W REFLEX MICROSCOPIC  CBC WITH DIFFERENTIAL/PLATELET  LACTIC ACID, PLASMA  LACTIC ACID, PLASMA  MAGNESIUM  BASIC METABOLIC PANEL  HEPATIC FUNCTION PANEL     EKG     RADIOLOGY    PROCEDURES:  Critical Care performed:  No  Procedures  The patient is on the cardiac monitor to evaluate for evidence of arrhythmia and/or significant heart rate changes.   MEDICATIONS ORDERED IN ED: Medications  lactated ringers bolus 1,000 mL (has no administration in time range)  thiamine (VITAMIN B1) injection 100 mg (has no administration in time range)  ondansetron (ZOFRAN) injection 4 mg (has no administration in time range)  HYDROmorphone (DILAUDID) injection 0.5 mg (has no administration in time range)     IMPRESSION / MDM / ASSESSMENT AND PLAN / ED COURSE  I reviewed the triage vital signs and the nursing notes.                              Patient's presentation is most consistent with acute presentation with potential threat to life or bodily function.  Differential diagnosis includes, but is not limited to, alcoholic pancreatitis, acute alcoholic hepatitis, alcoholic ketoacidosis, diverticulitis, appendicitis, colitis, gastroenteritis  Patient is a 47 year old male who presents today with nausea vomiting abdominal distention and abdominal pain.  His nausea vomiting started about 3 days ago without associated diarrhea and without hematemesis.  This started 2 days after he purposely stopped drinking.  He has had bilateral lower quadrant abdominal pain and distention for about 2 days left greater than right.  Woke up with fever today.  Has not had an alcoholic drink in about 5 days.  Since his last admission 2 months ago for alcoholic hepatitis he has decreased his alcohol intake to about 2-3 beers nightly.  Is febrile to 100.4 and tachycardic on arrival.  Mildly hypertensive.  He appears nontoxic.  Does have mild scleral icterus.  Abdomen is mildly distended and he is tender in the right upper and left lower quadrants with voluntary guarding.  Plan to obtain CBC CMP, lipase lactate urinalysis.  Will obtain CT of the abdomen pelvis given his significant tenderness.  Plan to give a bolus of LR, thiamine, Zofran and  Dilaudid.  Signed out to oncoming provider pending majority of workup.     FINAL CLINICAL IMPRESSION(S) / ED DIAGNOSES   Final diagnoses:  Nausea and vomiting, unspecified vomiting type     Rx / DC Orders   ED Discharge Orders     None        Note:  This document was prepared using Dragon voice recognition software and may include unintentional dictation errors.   Georga Hacking, MD 06/07/23 [phone]

## 2023-06-07 NOTE — Discharge Instructions (Addendum)
Take Zofran for nausea as prescribed.  Follow-up with your primary doctor this week for recheck of your blood test including your potassium and liver tests.  If you have any new, worsening or unexpected symptoms come back to the emergency department for reevaluation.

## 2023-06-08 ENCOUNTER — Encounter: Payer: Self-pay | Admitting: Family Medicine

## 2023-06-11 ENCOUNTER — Ambulatory Visit: Payer: Medicaid Other | Admitting: Family Medicine

## 2023-06-11 ENCOUNTER — Encounter: Payer: Self-pay | Admitting: Family Medicine

## 2023-06-11 VITALS — BP 146/90 | Ht 66.0 in | Wt 188.7 lb

## 2023-06-11 DIAGNOSIS — I10 Essential (primary) hypertension: Secondary | ICD-10-CM | POA: Diagnosis not present

## 2023-06-11 DIAGNOSIS — F1020 Alcohol dependence, uncomplicated: Secondary | ICD-10-CM

## 2023-06-11 DIAGNOSIS — N63 Unspecified lump in unspecified breast: Secondary | ICD-10-CM

## 2023-06-11 DIAGNOSIS — M1A49X0 Other secondary chronic gout, multiple sites, without tophus (tophi): Secondary | ICD-10-CM | POA: Diagnosis not present

## 2023-06-11 DIAGNOSIS — K709 Alcoholic liver disease, unspecified: Secondary | ICD-10-CM | POA: Diagnosis not present

## 2023-06-11 MED ORDER — ALLOPURINOL 300 MG PO TABS: 300.0000 mg | ORAL_TABLET | Freq: Every day | ORAL | 1 refills | Status: DC

## 2023-06-11 MED ORDER — NALTREXONE HCL 50 MG PO TABS: 50.0000 mg | ORAL_TABLET | Freq: Every day | ORAL | Status: DC

## 2023-06-11 NOTE — Assessment & Plan Note (Signed)
Chronic  Stable  Patient is working on decreasing alcohol intake  Continue naltrexone 50mg  daily  Refills provided

## 2023-06-11 NOTE — Assessment & Plan Note (Signed)
Chronic  Intermittent flare with AST of 540, AST 240 Recommended continued decrease with goal of abstinence from alcohol to preserve hepatic function  We will rechk CMP today  Recheck total bilirubin  Discussed importance of removing alcohol consumption  Patient states he would like referral to psychiatry for help with managing his addiction, psychiatry referral placed  Continue naltrexone 50mg  daily

## 2023-06-11 NOTE — Assessment & Plan Note (Addendum)
Acute  Likely 2/2 to heavy alcohol use  CMP collected to review total bilirubin level

## 2023-06-11 NOTE — Assessment & Plan Note (Signed)
Chronic  Stable  Symptoms controlled on allopurinol  Recommended avoiding dietary triggers including alcohol, red meat and shellfish  Refills provided for allopurinol 300mg  daily  Uric acid collected today

## 2023-06-11 NOTE — Progress Notes (Signed)
I,[name],acting as a Neurosurgeon for [employer], MD.,have documented all relevant documentation on the behalf of Shawn Ramp, MD,as directed by  Shawn Ramp, MD while in the presence of Shawn Ramp, MD.   Established patient visit   Patient: Shawn Wolfe   DOB: Jul 11, 1976   47 y.o. Male  MRN: [id number] Visit Date: 06/11/2023  Today's healthcare provider: [name], MD   No chief complaint on file.  Subjective    HPI   ED Follow up : Abdominal Pain and Emesis  Patient was evaluated in the ED on 06/07/23 for abdominal pain, nausea, vomiting  His labs noted hyperbilirubinemia with total 7.6, indirect 6.3 and direct 1.3; he was hypokalemic at 2.8, elevated liver enzymes 584 and 240 He was treated with IVF, potassium and magnesium and pain medication  CT of the abdomen showed heptatic steatosis, aortic atherosclerosis and mild splenomegaly He has underwent RUQ [place] that was consistent with fatty liver changes and gallbladder sludge without acute cholecystitis  Today he states he is feeling better but there are still some issues  States he could not tolerate liquids nor solids last Monday so he was not on medications  Reports he has been noticing that his urine is still darker than normal, mainly in the mornings  He states that he has drank close to 100oz of water daily  Reports abdominal pain was on bilateral flanks, reports some tenderness on the left flank today that is mild  --------------------------------------------------------------------------------------------------- Alcohol Use Disorder  Patient presents for follow up regarding alcohol use disorder  He reports he is still consuming alcohol just not as much as before  His last drink was 3 days ago. Reports he had 2 drinks and states that this is an improvement as he normally has 6-7 more drinks  He has been able to tolerate naltrexone 50mg  daily  He  would like to talk with a therapist to help with confidence in decreasing alcohol consumption Reports that he was drinking heavily up to 8 drinks prior to onset of the abdominal pain    HTN  Patient reports some questions regarding BP medications  He was not sure that losartan was a BP medication  He has been taking old prescription of amlodipine    Gout  States he has not been taking colchicine  He has continue allopurinol and states that his symptoms have been well controlled  Requesting refills   Right Breast Nodule  Reports right breast nodule  Notes some tenderness  Denies nipple drainage    Medications: Outpatient Medications Prior to Visit  Medication Sig   folic acid (FOLVITE) 1 MG tablet Take 1 tablet (1 mg total) by mouth daily.   losartan (COZAAR) 25 MG tablet Take 0.5 tablets (12.5 mg total) by mouth daily.   Multiple Vitamin (MULTIVITAMIN WITH MINERALS) TABS tablet Take 1 tablet by mouth daily.   ondansetron (ZOFRAN) 4 MG tablet Take 1 tablet (4 mg total) by mouth daily as needed for nausea or vomiting.   spironolactone (ALDACTONE) 25 MG tablet Take 0.5 tablets (12.5 mg total) by mouth daily.   [DISCONTINUED] allopurinol (ZYLOPRIM) 300 MG tablet Take 300 mg by mouth daily.   [DISCONTINUED] naltrexone (DEPADE) 50 MG tablet Take 1 tablet (50 mg total) by mouth daily.   colchicine 0.6 MG tablet Take one tablet BID until gout is gone. Thereafter, can take as needed for gout flare: take 1.2 mg x1, followed by 0.6mg  x1. (Patient not taking: Reported on 06/11/2023)  dapagliflozin propanediol (FARXIGA) 10 MG TABS tablet Take 1 tablet (10 mg total) by mouth daily. (Patient not taking: Reported on 06/11/2023)   No facility-administered medications prior to visit.    Review of Systems     Objective    Ht 5\' 6"  (1.676 m)   Wt 188 lb 11.2 oz (85.6 kg)   BMI 30.46 kg/m    Physical Exam Vitals reviewed.  Constitutional:      General: He is not in acute distress.     Appearance: Normal appearance. He is not ill-appearing, toxic-appearing or diaphoretic.  Eyes:     Conjunctiva/sclera: Conjunctivae normal.  Cardiovascular:     Rate and Rhythm: Normal rate and regular rhythm.     Pulses: Normal pulses.     Heart sounds: Normal heart sounds. No murmur heard.    No friction rub. No gallop.  Pulmonary:     Effort: Pulmonary effort is normal. No respiratory distress.     Breath sounds: Normal breath sounds. No stridor. No wheezing, rhonchi or rales.  Abdominal:     General: Bowel sounds are normal. There is no distension.     Palpations: Abdomen is soft. There is no fluid wave or mass.     Tenderness: There is abdominal tenderness in the left lower quadrant.     Hernia: A hernia is present. Hernia is present in the ventral area.  Musculoskeletal:     Right lower leg: No edema.     Left lower leg: No edema.  Skin:    Findings: No erythema or rash.  Neurological:     Mental Status: He is alert and oriented to person, place, and time.       No results found for any visits on 06/11/23.  Assessment & Plan     Problem List Items Addressed This Visit       Cardiovascular and Mediastinum   Benign essential HTN    Chronic  Elevated BP in office  Patient has been out of bp medications and not sure which ones to take He will start/restart losartan 12.5mg  daily, DC amlodipine due to LE edema , continue spironolactone 12.5mg  daily  Follow up in 3 weeks for BP check       Relevant Orders   Comprehensive metabolic panel     Digestive   Alcoholic liver disease (HCC) - Primary    Chronic  Intermittent flare with AST of 540, AST 240 Recommended continued decrease with goal of abstinence from alcohol to preserve hepatic function  We will rechk CMP today  Recheck total bilirubin  Discussed importance of removing alcohol consumption  Patient states he would like referral to psychiatry for help with managing his addiction, psychiatry referral placed   Continue naltrexone 50mg  daily       Relevant Orders   Ambulatory referral to Psychiatry     Other   Gout    Chronic  Stable  Symptoms controlled on allopurinol  Recommended avoiding dietary triggers including alcohol, red meat and shellfish  Refills provided for allopurinol 300mg  daily  Uric acid collected today       Relevant Orders   Uric acid   Hyperbilirubinemia    Acute  Likely 2/2 to heavy alcohol use  CMP collected to review total bilirubin level        Relevant Orders   Comprehensive metabolic panel   Alcohol use disorder, severe, dependence (HCC)    Chronic  Stable  Patient is working on decreasing alcohol intake  Continue naltrexone  50mg  daily  Refills provided       Relevant Orders   Comprehensive metabolic panel   Breast nodule   Relevant Orders   US BREAST COMPLETE UNI RIGHT INC AXILLA     Return in about 3 weeks (around 07/02/2023) for HTN, abdomen .        The entirety of the information documented in the History of Present Illness, Review of Systems and Physical Exam were personally obtained by me. Portions of this information were initially documented by [name],CMA. I, Shawn Ramp, MD have reviewed the documentation above for thoroughness and accuracy.      Shawn Ramp, MD  [facility] [phone] (phone) [phone] (fax)  [facility]

## 2023-06-11 NOTE — Assessment & Plan Note (Addendum)
Chronic  Elevated BP in office  Patient has been out of bp medications and not sure which ones to take He will start/restart losartan 12.5mg  daily, DC amlodipine due to LE edema , continue spironolactone 12.5mg  daily  Follow up in 3 weeks for BP check

## 2023-06-12 ENCOUNTER — Other Ambulatory Visit: Payer: Self-pay | Admitting: Family Medicine

## 2023-06-12 DIAGNOSIS — E876 Hypokalemia: Secondary | ICD-10-CM

## 2023-06-12 DIAGNOSIS — N63 Unspecified lump in unspecified breast: Secondary | ICD-10-CM

## 2023-06-12 DIAGNOSIS — R748 Abnormal levels of other serum enzymes: Secondary | ICD-10-CM

## 2023-06-12 LAB — COMPREHENSIVE METABOLIC PANEL
ALT: 165 IU/L — ABNORMAL HIGH (ref 0–44)
AST: 243 IU/L — ABNORMAL HIGH (ref 0–40)
Albumin: 4.6 g/dL (ref 4.1–5.1)
Alkaline Phosphatase: 80 IU/L (ref 44–121)
BUN/Creatinine Ratio: 7 — ABNORMAL LOW (ref 9–20)
BUN: 5 mg/dL — ABNORMAL LOW (ref 6–24)
Bilirubin Total: 3.1 mg/dL — ABNORMAL HIGH (ref 0.0–1.2)
CO2: 25 mmol/L (ref 20–29)
Calcium: 9.6 mg/dL (ref 8.7–10.2)
Chloride: 95 mmol/L — ABNORMAL LOW (ref 96–106)
Creatinine, Ser: 0.73 mg/dL — ABNORMAL LOW (ref 0.76–1.27)
Globulin, Total: 2.6 g/dL (ref 1.5–4.5)
Glucose: 106 mg/dL — ABNORMAL HIGH (ref 70–99)
Potassium: 2.8 mmol/L — ABNORMAL LOW (ref 3.5–5.2)
Sodium: 143 mmol/L (ref 134–144)
Total Protein: 7.2 g/dL (ref 6.0–8.5)
eGFR: 112 mL/min/{1.73_m2} (ref >59)

## 2023-06-12 LAB — URIC ACID: Uric Acid: 7.8 mg/dL (ref 3.8–8.4)

## 2023-06-12 MED ORDER — POTASSIUM CHLORIDE ER 10 MEQ PO TBCR
20.0000 meq | EXTENDED_RELEASE_TABLET | Freq: Two times a day (BID) | ORAL | 0 refills | Status: DC
Start: 2023-06-12 — End: 2023-10-03

## 2023-06-18 ENCOUNTER — Ambulatory Visit
Admission: RE | Admit: 2023-06-18 | Discharge: 2023-06-18 | Disposition: A | Discharge status: Home / Self Care | Payer: Medicaid Other | Source: Ambulatory Visit | Attending: Family Medicine | Admitting: Family Medicine

## 2023-06-18 ENCOUNTER — Other Ambulatory Visit: Payer: Self-pay | Admitting: Family Medicine

## 2023-06-18 DIAGNOSIS — N62 Hypertrophy of breast: Secondary | ICD-10-CM | POA: Diagnosis not present

## 2023-06-18 DIAGNOSIS — N63 Unspecified lump in unspecified breast: Secondary | ICD-10-CM | POA: Diagnosis not present

## 2023-06-18 DIAGNOSIS — K709 Alcoholic liver disease, unspecified: Secondary | ICD-10-CM

## 2023-06-18 DIAGNOSIS — I10 Essential (primary) hypertension: Secondary | ICD-10-CM

## 2023-06-18 DIAGNOSIS — F1020 Alcohol dependence, uncomplicated: Secondary | ICD-10-CM

## 2023-06-18 DIAGNOSIS — M1A49X0 Other secondary chronic gout, multiple sites, without tophus (tophi): Secondary | ICD-10-CM

## 2023-06-19 ENCOUNTER — Ambulatory Visit: Payer: Medicaid Other | Admitting: Family Medicine

## 2023-06-25 DIAGNOSIS — Z419 Encounter for procedure for purposes other than remedying health state, unspecified: Secondary | ICD-10-CM | POA: Diagnosis not present

## 2023-07-04 NOTE — Progress Notes (Deleted)
      Established patient visit   Patient: Shawn Wolfe   DOB: [birth year]   47 y.o. Male  MRN: [id number] Visit Date: 07/05/2023  Today's healthcare provider: [name], MD   No chief complaint on file.  Subjective      ***  Medications: Outpatient Medications Prior to Visit  Medication Sig   allopurinol (ZYLOPRIM) 300 MG tablet Take 1 tablet (300 mg total) by mouth daily.   colchicine 0.6 MG tablet Take one tablet BID until gout is gone. Thereafter, can take as needed for gout flare: take 1.2 mg x1, followed by 0.6mg  x1. (Patient not taking: Reported on 06/11/2023)   dapagliflozin propanediol (FARXIGA) 10 MG TABS tablet Take 1 tablet (10 mg total) by mouth daily. (Patient not taking: Reported on 06/11/2023)   folic acid (FOLVITE) 1 MG tablet Take 1 tablet (1 mg total) by mouth daily.   losartan (COZAAR) 25 MG tablet Take 0.5 tablets (12.5 mg total) by mouth daily.   Multiple Vitamin (MULTIVITAMIN WITH MINERALS) TABS tablet Take 1 tablet by mouth daily.   naltrexone (DEPADE) 50 MG tablet Take 1 tablet (50 mg total) by mouth daily.   ondansetron (ZOFRAN) 4 MG tablet Take 1 tablet (4 mg total) by mouth daily as needed for nausea or vomiting.   potassium chloride (KLOR-CON) 10 MEQ tablet Take 2 tablets (20 mEq total) by mouth 2 (two) times daily for 7 days.   spironolactone (ALDACTONE) 25 MG tablet Take 0.5 tablets (12.5 mg total) by mouth daily.   No facility-administered medications prior to visit.    Review of Systems  {Labs  Heme  Chem  Endocrine  Serology  Results Review (optional):23779}   Objective    There were no vitals taken for this visit. {Show previous vital signs (optional):23777}  Physical Exam  ***  No results found for any visits on 07/05/23.  Assessment & Plan     Problem List Items Addressed This Visit   None    No follow-ups on file.         [name], MD  [facility] [phone] (phone) [phone] (fax)  [facility]

## 2023-07-05 ENCOUNTER — Ambulatory Visit: Payer: Medicaid Other | Admitting: Family Medicine

## 2023-07-05 DIAGNOSIS — I10 Essential (primary) hypertension: Secondary | ICD-10-CM

## 2023-07-05 DIAGNOSIS — K709 Alcoholic liver disease, unspecified: Secondary | ICD-10-CM

## 2023-07-10 ENCOUNTER — Ambulatory Visit: Payer: Medicaid Other | Admitting: Family Medicine

## 2023-07-10 NOTE — Progress Notes (Deleted)
      Established patient visit   Patient: Shawn Wolfe   DOB: 1976-06-07   47 y.o. Male  MRN: [id number] Visit Date: 07/10/2023  Today's healthcare provider: [name], MD   No chief complaint on file.  Subjective      HTN    Alcohol Use Disorder    Alcoholic Liver Disease    Medications: Outpatient Medications Prior to Visit  Medication Sig  . allopurinol (ZYLOPRIM) 300 MG tablet Take 1 tablet (300 mg total) by mouth daily.  . colchicine 0.6 MG tablet Take one tablet BID until gout is gone. Thereafter, can take as needed for gout flare: take 1.2 mg x1, followed by 0.6mg  x1. (Patient not taking: Reported on 06/11/2023)  . dapagliflozin propanediol (FARXIGA) 10 MG TABS tablet Take 1 tablet (10 mg total) by mouth daily. (Patient not taking: Reported on 06/11/2023)  . folic acid (FOLVITE) 1 MG tablet Take 1 tablet (1 mg total) by mouth daily.  Marland Kitchen losartan (COZAAR) 25 MG tablet Take 0.5 tablets (12.5 mg total) by mouth daily.  . Multiple Vitamin (MULTIVITAMIN WITH MINERALS) TABS tablet Take 1 tablet by mouth daily.  . naltrexone (DEPADE) 50 MG tablet Take 1 tablet (50 mg total) by mouth daily.  . ondansetron (ZOFRAN) 4 MG tablet Take 1 tablet (4 mg total) by mouth daily as needed for nausea or vomiting.  . potassium chloride (KLOR-CON) 10 MEQ tablet Take 2 tablets (20 mEq total) by mouth 2 (two) times daily for 7 days.  Marland Kitchen spironolactone (ALDACTONE) 25 MG tablet Take 0.5 tablets (12.5 mg total) by mouth daily.   No facility-administered medications prior to visit.    Review of Systems  {Insert previous labs (optional):23779}  {See past labs  Heme  Chem  Endocrine  Serology  Results Review (optional):1}   Objective    There were no vitals taken for this visit. {Insert last BP/Wt (optional):23777}  {See vitals history (optional):1}  Physical Exam  ***  No results found for any visits on 07/10/23.  Assessment & Plan     Problem List Items  Addressed This Visit   None    No follow-ups on file.         [name], MD  [facility] [phone] (phone) [phone] (fax)  [facility]

## 2023-07-26 DIAGNOSIS — Z419 Encounter for procedure for purposes other than remedying health state, unspecified: Secondary | ICD-10-CM | POA: Diagnosis not present

## 2023-07-27 NOTE — Progress Notes (Deleted)
      Established patient visit   Patient: Shawn Wolfe   DOB: 1976/02/02   47 y.o. Male  MRN: [id number] Visit Date: 08/01/2023  Today's healthcare provider: [name], MD   No chief complaint on file.  Subjective      HTN  Losartan 25mg  and spironolactone 25mg  restarted   Alcohol Use Disorder    Alcoholic Liver Disease    Medications: Outpatient Medications Prior to Visit  Medication Sig   allopurinol (ZYLOPRIM) 300 MG tablet Take 1 tablet (300 mg total) by mouth daily.   colchicine 0.6 MG tablet Take one tablet BID until gout is gone. Thereafter, can take as needed for gout flare: take 1.2 mg x1, followed by 0.6mg  x1. (Patient not taking: Reported on 06/11/2023)   dapagliflozin propanediol (FARXIGA) 10 MG TABS tablet Take 1 tablet (10 mg total) by mouth daily. (Patient not taking: Reported on 06/11/2023)   folic acid (FOLVITE) 1 MG tablet Take 1 tablet (1 mg total) by mouth daily.   losartan (COZAAR) 25 MG tablet Take 0.5 tablets (12.5 mg total) by mouth daily.   Multiple Vitamin (MULTIVITAMIN WITH MINERALS) TABS tablet Take 1 tablet by mouth daily.   naltrexone (DEPADE) 50 MG tablet Take 1 tablet (50 mg total) by mouth daily.   ondansetron (ZOFRAN) 4 MG tablet Take 1 tablet (4 mg total) by mouth daily as needed for nausea or vomiting.   potassium chloride (KLOR-CON) 10 MEQ tablet Take 2 tablets (20 mEq total) by mouth 2 (two) times daily for 7 days.   spironolactone (ALDACTONE) 25 MG tablet Take 0.5 tablets (12.5 mg total) by mouth daily.   No facility-administered medications prior to visit.    Review of Systems  {Insert previous labs (optional):23779}  {See past labs  Heme  Chem  Endocrine  Serology  Results Review (optional):1}   Objective    There were no vitals taken for this visit. {Insert last BP/Wt (optional):23777}  {See vitals history (optional):1}  Physical Exam  ***  No results found for any visits on 08/01/23.  Assessment  & Plan     Problem List Items Addressed This Visit   None    No follow-ups on file.         [name], MD  [facility] [phone] (phone) [phone] (fax)  [facility]

## 2023-08-01 ENCOUNTER — Ambulatory Visit: Payer: Medicaid Other | Admitting: Family Medicine

## 2023-08-01 DIAGNOSIS — K709 Alcoholic liver disease, unspecified: Secondary | ICD-10-CM

## 2023-08-01 DIAGNOSIS — F1020 Alcohol dependence, uncomplicated: Secondary | ICD-10-CM

## 2023-08-03 ENCOUNTER — Ambulatory Visit: Payer: Medicaid Other | Admitting: Family Medicine

## 2023-08-03 ENCOUNTER — Encounter: Payer: Self-pay | Admitting: Family Medicine

## 2023-08-03 VITALS — BP 148/68 | HR 86 | Temp 99.0°F | Ht 66.0 in | Wt 196.0 lb

## 2023-08-03 DIAGNOSIS — I10 Essential (primary) hypertension: Secondary | ICD-10-CM | POA: Diagnosis not present

## 2023-08-03 DIAGNOSIS — F1010 Alcohol abuse, uncomplicated: Secondary | ICD-10-CM | POA: Diagnosis not present

## 2023-08-03 DIAGNOSIS — K709 Alcoholic liver disease, unspecified: Secondary | ICD-10-CM | POA: Diagnosis not present

## 2023-08-03 DIAGNOSIS — E876 Hypokalemia: Secondary | ICD-10-CM

## 2023-08-03 MED ORDER — SPIRONOLACTONE 25 MG PO TABS: 25.0000 mg | ORAL_TABLET | Freq: Every day | ORAL | 1 refills | Status: DC

## 2023-08-03 MED ORDER — LOSARTAN POTASSIUM 25 MG PO TABS: 25.0000 mg | ORAL_TABLET | Freq: Every day | ORAL | 1 refills | Status: DC

## 2023-08-03 NOTE — Patient Instructions (Signed)
VISIT SUMMARY:  During your visit, we discussed several health concerns including a right-sided breast mass (gynecomastia), your struggles with alcohol cessation, recent mood decline, and medication adherence. We also discussed your history of liver disease, hypertension, and gout. Your blood pressure was elevated during the visit, and you reported a recent weight gain.  YOUR PLAN:  -GYNECOMASTIA: This is a condition where men develop enlarged breasts. In your case, it's likely due to alcohol use and your spironolactone treatment. As it's not causing you pain or discomfort, and you don't want surgery, we'll continue to monitor it without any changes to your treatment.  -ALCOHOL USE DISORDER: This is a condition where you have difficulty controlling your drinking. You've acknowledged the need for therapy, and we've provided you with contact information for a counselor. We'll continue your Naltrexone treatment to help with alcohol cravings.  -LIVER DISEASE: This is a condition where your liver isn't functioning properly, likely due to alcohol use. We'll continue your current medications and perform a metabolic panel today to assess your liver function. If your liver enzymes remain elevated, we may refer you to a gastroenterologist.  -HYPERTENSION: This is a condition where your blood pressure is consistently too high. You've admitted to inconsistent medication use, so we'll continue your current medications and ask you to check your blood pressure at home daily for a week after taking your medication. If your readings remain above 130/80, we'll increase your medication dosage.  -DEPRESSION: This is a condition where you feel persistently sad or lose interest in activities. You've identified your girlfriend's health status and personal alcohol use as contributing factors. We recommend engaging with therapy/counseling services to help manage your mood.  INSTRUCTIONS:  Please follow up in 3 weeks to  reassess your blood pressure control, liver function, and progress with therapy/counseling services. Remember to check your blood pressure at home daily for a week after taking your medication. If your readings remain above 130/80, please contact us.
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
## 2023-08-16 NOTE — Progress Notes (Deleted)
Established patient visit   Patient: Shawn Wolfe   DOB: 03-22-1976   47 y.o. Male  MRN: [id number] Visit Date: 08/23/2023  Today's healthcare provider: [name], MD   No chief complaint on file.  Subjective       Discussed the use of AI scribe software for clinical note transcription with the patient, who gave verbal consent to proceed.  History of Present Illness             Medications: Outpatient Medications Prior to Visit  Medication Sig   allopurinol (ZYLOPRIM) 300 MG tablet Take 1 tablet (300 mg total) by mouth daily.   colchicine 0.6 MG tablet Take one tablet BID until gout is gone. Thereafter, can take as needed for gout flare: take 1.2 mg x1, followed by 0.6mg  x1. (Patient not taking: Reported on 06/11/2023)   dapagliflozin propanediol (FARXIGA) 10 MG TABS tablet Take 1 tablet (10 mg total) by mouth daily. (Patient not taking: Reported on 06/11/2023)   folic acid (FOLVITE) 1 MG tablet Take 1 tablet (1 mg total) by mouth daily.   losartan (COZAAR) 25 MG tablet Take 1 tablet (25 mg total) by mouth daily.   Multiple Vitamin (MULTIVITAMIN WITH MINERALS) TABS tablet Take 1 tablet by mouth daily.   naltrexone (DEPADE) 50 MG tablet Take 1 tablet (50 mg total) by mouth daily.   ondansetron (ZOFRAN) 4 MG tablet Take 1 tablet (4 mg total) by mouth daily as needed for nausea or vomiting.   potassium chloride (KLOR-CON) 10 MEQ tablet Take 2 tablets (20 mEq total) by mouth 2 (two) times daily for 7 days.   spironolactone (ALDACTONE) 25 MG tablet Take 1 tablet (25 mg total) by mouth daily.   No facility-administered medications prior to visit.    Review of Systems  Last metabolic panel Lab Results  Component Value Date   GLUCOSE 114 (H) 08/03/2023   NA 144 08/03/2023   K 2.9 (L) 08/03/2023   CL 94 (L) 08/03/2023   CO2 30 (H) 08/03/2023   BUN 3 (L) 08/03/2023   CREATININE 0.81 08/03/2023   EGFR 108 08/03/2023   CALCIUM 7.9 (L) 08/03/2023    PHOS 3.6 04/09/2023   PROT 6.7 08/03/2023   ALBUMIN 4.0 (L) 08/03/2023   LABGLOB 2.7 08/03/2023   BILITOT 3.1 (H) 08/03/2023   ALKPHOS 59 08/03/2023   AST 343 (H) 08/03/2023   ALT 141 (H) 08/03/2023   ANIONGAP 15 06/07/2023     {See past labs  Heme  Chem  Endocrine  Serology  Results Review (optional):1}   Objective    There were no vitals taken for this visit. {Insert last BP/Wt (optional):23777}{See vitals history (optional):1}   Physical Exam  ***  No results found for any visits on 08/23/23.  Assessment & Plan     Problem List Items Addressed This Visit   None   Assessment and Plan              No follow-ups on file.         [name], MD  [facility] [phone] (phone) [phone] (fax)  [facility]

## 2023-08-23 ENCOUNTER — Ambulatory Visit: Payer: Medicaid Other | Admitting: Family Medicine

## 2023-08-26 DIAGNOSIS — Z419 Encounter for procedure for purposes other than remedying health state, unspecified: Secondary | ICD-10-CM | POA: Diagnosis not present

## 2023-08-28 NOTE — Progress Notes (Deleted)
Established patient visit   Patient: Shawn Wolfe   DOB: 1976/10/03   47 y.o. Male  MRN: [id number] Visit Date: 08/29/2023  Today's healthcare provider: [name], MD   No chief complaint on file.  Subjective       Discussed the use of AI scribe software for clinical note transcription with the patient, who gave verbal consent to proceed.  History of Present Illness             Medications: Outpatient Medications Prior to Visit  Medication Sig   allopurinol (ZYLOPRIM) 300 MG tablet Take 1 tablet (300 mg total) by mouth daily.   colchicine 0.6 MG tablet Take one tablet BID until gout is gone. Thereafter, can take as needed for gout flare: take 1.2 mg x1, followed by 0.6mg  x1. (Patient not taking: Reported on 06/11/2023)   dapagliflozin propanediol (FARXIGA) 10 MG TABS tablet Take 1 tablet (10 mg total) by mouth daily. (Patient not taking: Reported on 06/11/2023)   folic acid (FOLVITE) 1 MG tablet Take 1 tablet (1 mg total) by mouth daily.   losartan (COZAAR) 25 MG tablet Take 1 tablet (25 mg total) by mouth daily.   Multiple Vitamin (MULTIVITAMIN WITH MINERALS) TABS tablet Take 1 tablet by mouth daily.   naltrexone (DEPADE) 50 MG tablet Take 1 tablet (50 mg total) by mouth daily.   ondansetron (ZOFRAN) 4 MG tablet Take 1 tablet (4 mg total) by mouth daily as needed for nausea or vomiting.   potassium chloride (KLOR-CON) 10 MEQ tablet Take 2 tablets (20 mEq total) by mouth 2 (two) times daily for 7 days.   spironolactone (ALDACTONE) 25 MG tablet Take 1 tablet (25 mg total) by mouth daily.   No facility-administered medications prior to visit.    Review of Systems  Last metabolic panel Lab Results  Component Value Date   GLUCOSE 114 (H) 08/03/2023   NA 144 08/03/2023   K 2.9 (L) 08/03/2023   CL 94 (L) 08/03/2023   CO2 30 (H) 08/03/2023   BUN 3 (L) 08/03/2023   CREATININE 0.81 08/03/2023   EGFR 108 08/03/2023   CALCIUM 7.9 (L) 08/03/2023    PHOS 3.6 04/09/2023   PROT 6.7 08/03/2023   ALBUMIN 4.0 (L) 08/03/2023   LABGLOB 2.7 08/03/2023   BILITOT 3.1 (H) 08/03/2023   ALKPHOS 59 08/03/2023   AST 343 (H) 08/03/2023   ALT 141 (H) 08/03/2023   ANIONGAP 15 06/07/2023     {See past labs  Heme  Chem  Endocrine  Serology  Results Review (optional):1}   Objective    There were no vitals taken for this visit. {Insert last BP/Wt (optional):23777}{See vitals history (optional):1}   Physical Exam  ***  No results found for any visits on 08/29/23.  Assessment & Plan     Problem List Items Addressed This Visit   None   Assessment and Plan              No follow-ups on file.         [name], MD  [facility] [phone] (phone) [phone] (fax)  [facility]

## 2023-08-29 ENCOUNTER — Ambulatory Visit: Payer: Medicaid Other | Admitting: Family Medicine

## 2023-09-06 ENCOUNTER — Ambulatory Visit: Payer: Medicaid Other | Admitting: Family Medicine

## 2023-09-06 NOTE — Progress Notes (Deleted)
Established patient visit   Patient: Shawn Wolfe   DOB: 05-25-1976   47 y.o. Male  MRN: [id number] Visit Date: 09/06/2023  Today's healthcare provider: [name], MD   No chief complaint on file.  Subjective       Discussed the use of AI scribe software for clinical note transcription with the patient, who gave verbal consent to proceed.  History of Present Illness             Medications: Outpatient Medications Prior to Visit  Medication Sig   allopurinol (ZYLOPRIM) 300 MG tablet Take 1 tablet (300 mg total) by mouth daily.   colchicine 0.6 MG tablet Take one tablet BID until gout is gone. Thereafter, can take as needed for gout flare: take 1.2 mg x1, followed by 0.6mg  x1. (Patient not taking: Reported on 06/11/2023)   dapagliflozin propanediol (FARXIGA) 10 MG TABS tablet Take 1 tablet (10 mg total) by mouth daily. (Patient not taking: Reported on 06/11/2023)   folic acid (FOLVITE) 1 MG tablet Take 1 tablet (1 mg total) by mouth daily.   losartan (COZAAR) 25 MG tablet Take 1 tablet (25 mg total) by mouth daily.   Multiple Vitamin (MULTIVITAMIN WITH MINERALS) TABS tablet Take 1 tablet by mouth daily.   naltrexone (DEPADE) 50 MG tablet Take 1 tablet (50 mg total) by mouth daily.   ondansetron (ZOFRAN) 4 MG tablet Take 1 tablet (4 mg total) by mouth daily as needed for nausea or vomiting.   potassium chloride (KLOR-CON) 10 MEQ tablet Take 2 tablets (20 mEq total) by mouth 2 (two) times daily for 7 days.   spironolactone (ALDACTONE) 25 MG tablet Take 1 tablet (25 mg total) by mouth daily.   No facility-administered medications prior to visit.    Review of Systems  Last metabolic panel Lab Results  Component Value Date   GLUCOSE 114 (H) 08/03/2023   NA 144 08/03/2023   K 2.9 (L) 08/03/2023   CL 94 (L) 08/03/2023   CO2 30 (H) 08/03/2023   BUN 3 (L) 08/03/2023   CREATININE 0.81 08/03/2023   EGFR 108 08/03/2023   CALCIUM 7.9 (L) 08/03/2023    PHOS 3.6 04/09/2023   PROT 6.7 08/03/2023   ALBUMIN 4.0 (L) 08/03/2023   LABGLOB 2.7 08/03/2023   BILITOT 3.1 (H) 08/03/2023   ALKPHOS 59 08/03/2023   AST 343 (H) 08/03/2023   ALT 141 (H) 08/03/2023   ANIONGAP 15 06/07/2023     {See past labs  Heme  Chem  Endocrine  Serology  Results Review (optional):1}   Objective    There were no vitals taken for this visit. {Insert last BP/Wt (optional):23777}{See vitals history (optional):1}   Physical Exam  ***  No results found for any visits on 09/06/23.  Assessment & Plan     Problem List Items Addressed This Visit   None   Assessment and Plan              No follow-ups on file.         [name], MD  [facility] [phone] (phone) [phone] (fax)  [facility]

## 2023-09-25 DIAGNOSIS — Z419 Encounter for procedure for purposes other than remedying health state, unspecified: Secondary | ICD-10-CM | POA: Diagnosis not present

## 2023-10-02 ENCOUNTER — Telehealth: Payer: Self-pay | Admitting: Family Medicine

## 2023-10-02 DIAGNOSIS — M1A49X0 Other secondary chronic gout, multiple sites, without tophus (tophi): Secondary | ICD-10-CM

## 2023-10-02 DIAGNOSIS — I10 Essential (primary) hypertension: Secondary | ICD-10-CM

## 2023-10-02 DIAGNOSIS — K709 Alcoholic liver disease, unspecified: Secondary | ICD-10-CM

## 2023-10-02 DIAGNOSIS — F1020 Alcohol dependence, uncomplicated: Secondary | ICD-10-CM
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
## 2023-10-02 NOTE — Telephone Encounter (Signed)
Patient will need approval from provider for early refill on some medications, routing to office for provider approval.

## 2023-10-03 MED ORDER — ALLOPURINOL 300 MG PO TABS
300.0000 mg | ORAL_TABLET | Freq: Every day | ORAL | 1 refills | Status: DC
Start: 2023-10-03 — End: 2023-10-15

## 2023-10-03 MED ORDER — FOLIC ACID 1 MG PO TABS: 1.0000 mg | ORAL_TABLET | Freq: Every day | ORAL | 3 refills | Status: AC

## 2023-10-03 MED ORDER — SPIRONOLACTONE 25 MG PO TABS
25.0000 mg | ORAL_TABLET | Freq: Every day | ORAL | 1 refills | Status: DC
Start: 2023-10-03 — End: 2023-10-15

## 2023-10-03 MED ORDER — LOSARTAN POTASSIUM 25 MG PO TABS
25.0000 mg | ORAL_TABLET | Freq: Every day | ORAL | 1 refills | Status: DC
Start: 2023-10-03 — End: 2023-10-15

## 2023-10-03 MED ORDER — NALTREXONE HCL 50 MG PO TABS: 50.0000 mg | ORAL_TABLET | Freq: Every day | ORAL | Status: AC
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
## 2023-10-13 NOTE — Progress Notes (Unsigned)
Established patient visit   Patient: Carlson Phoebus   DOB: [birth year]   47 y.o. Male  MRN: [id number] Visit Date: 10/15/2023  Today's healthcare provider: [name], MD   No chief complaint on file.  Subjective       Discussed the use of AI scribe software for clinical note transcription with the patient, who gave verbal consent to proceed.  History of Present Illness             Past Medical History:  Diagnosis Date   Abnormal LFTs    Alcohol abuse    Allergy    Arthritis    B12 deficiency    Blood transfusion without reported diagnosis    Chronic kidney disease    Gout    Heart murmur    History of echocardiogram    a. 09/2021 Echo: EF 60-65%, no rwma, nl RV fxn, triv MR.   Hypertension    Iron deficiency anemia    Sleep apnea    Thalassemia minor     Medications: Outpatient Medications Prior to Visit  Medication Sig   allopurinol (ZYLOPRIM) 300 MG tablet Take 1 tablet (300 mg total) by mouth daily.   colchicine 0.6 MG tablet Take one tablet BID until gout is gone. Thereafter, can take as needed for gout flare: take 1.2 mg x1, followed by 0.6mg  x1. (Patient not taking: Reported on 06/11/2023)   dapagliflozin propanediol (FARXIGA) 10 MG TABS tablet Take 1 tablet (10 mg total) by mouth daily. (Patient not taking: Reported on 06/11/2023)   folic acid (FOLVITE) 1 MG tablet Take 1 tablet (1 mg total) by mouth daily.   losartan (COZAAR) 25 MG tablet Take 1 tablet (25 mg total) by mouth daily.   Multiple Vitamin (MULTIVITAMIN WITH MINERALS) TABS tablet Take 1 tablet by mouth daily.   naltrexone (DEPADE) 50 MG tablet Take 1 tablet (50 mg total) by mouth daily.   ondansetron (ZOFRAN) 4 MG tablet Take 1 tablet (4 mg total) by mouth daily as needed for nausea or vomiting.   spironolactone (ALDACTONE) 25 MG tablet Take 1 tablet (25 mg total) by mouth daily.   No facility-administered medications prior to visit.    Review of Systems  {Insert  previous labs (optional):23779} {See past labs  Heme  Chem  Endocrine  Serology  Results Review (optional):1}   Objective    There were no vitals taken for this visit. {Insert last BP/Wt (optional):23777}{See vitals history (optional):1}   Physical Exam  ***  No results found for any visits on 10/15/23.  Assessment & Plan     Problem List Items Addressed This Visit   None   Assessment and Plan              No follow-ups on file.         [name], MD  [facility] [phone] (phone) [phone] (fax)  [facility]

## 2023-10-15 ENCOUNTER — Ambulatory Visit: Payer: Medicaid Other | Admitting: Family Medicine

## 2023-10-15 ENCOUNTER — Encounter: Payer: Self-pay | Admitting: Family Medicine

## 2023-10-15 VITALS — BP 140/100 | HR 103 | Ht 66.0 in | Wt 190.4 lb

## 2023-10-15 DIAGNOSIS — K709 Alcoholic liver disease, unspecified: Secondary | ICD-10-CM | POA: Diagnosis not present

## 2023-10-15 DIAGNOSIS — M1A49X0 Other secondary chronic gout, multiple sites, without tophus (tophi): Secondary | ICD-10-CM | POA: Diagnosis not present

## 2023-10-15 DIAGNOSIS — Z13220 Encounter for screening for lipoid disorders: Secondary | ICD-10-CM

## 2023-10-15 DIAGNOSIS — E538 Deficiency of other specified B group vitamins: Secondary | ICD-10-CM | POA: Diagnosis not present

## 2023-10-15 DIAGNOSIS — D508 Other iron deficiency anemias: Secondary | ICD-10-CM

## 2023-10-15 DIAGNOSIS — I10 Essential (primary) hypertension: Secondary | ICD-10-CM | POA: Diagnosis not present

## 2023-10-15 DIAGNOSIS — F1020 Alcohol dependence, uncomplicated: Secondary | ICD-10-CM

## 2023-10-15 DIAGNOSIS — E876 Hypokalemia: Secondary | ICD-10-CM

## 2023-10-15 DIAGNOSIS — Z0000 Encounter for general adult medical examination without abnormal findings: Secondary | ICD-10-CM | POA: Insufficient documentation

## 2023-10-15 DIAGNOSIS — Z131 Encounter for screening for diabetes mellitus: Secondary | ICD-10-CM

## 2023-10-15 DIAGNOSIS — Z0001 Encounter for general adult medical examination with abnormal findings: Secondary | ICD-10-CM | POA: Diagnosis not present

## 2023-10-15 MED ORDER — SPIRONOLACTONE 25 MG PO TABS: 25.0000 mg | ORAL_TABLET | Freq: Every day | ORAL | 3 refills | Status: AC

## 2023-10-15 MED ORDER — POTASSIUM CHLORIDE CRYS ER 20 MEQ PO TBCR: 20.0000 meq | EXTENDED_RELEASE_TABLET | Freq: Two times a day (BID) | ORAL | 2 refills | Status: AC

## 2023-10-15 MED ORDER — ALLOPURINOL 300 MG PO TABS: 300.0000 mg | ORAL_TABLET | Freq: Every day | ORAL | 3 refills | Status: DC

## 2023-10-15 MED ORDER — LOSARTAN POTASSIUM 25 MG PO TABS: 25.0000 mg | ORAL_TABLET | Freq: Every day | ORAL | 3 refills | Status: AC

## 2023-10-15 NOTE — Assessment & Plan Note (Signed)
Chronic conditions are stable  Patient was counseled on benefits of regular physical activity with goal of 150 minutes of moderate to vigurous intensity 4 days per week  Patient was counseled to consume well balanced diet of fruits, vegetables, limited saturated fats and limited sugary foods and beverages with emphasis on consuming 6-8 glasses of water daily  Screening recommended today: A1c, lipids Recommended avoiding alcohol due to elevated liver enzymes and worsened blood pressure and risk of heart disease
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
## 2023-10-16 LAB — CMP14+EGFR
ALT: 95 [IU]/L — ABNORMAL HIGH (ref 0–44)
AST: 147 [IU]/L — ABNORMAL HIGH (ref 0–40)
Albumin: 4.6 g/dL (ref 4.1–5.1)
Alkaline Phosphatase: 44 [IU]/L (ref 44–121)
BUN/Creatinine Ratio: 8 — ABNORMAL LOW (ref 9–20)
BUN: 6 mg/dL (ref 6–24)
Bilirubin Total: 2.3 mg/dL — ABNORMAL HIGH (ref 0.0–1.2)
CO2: 24 mmol/L (ref 20–29)
Calcium: 9.4 mg/dL (ref 8.7–10.2)
Chloride: 94 mmol/L — ABNORMAL LOW (ref 96–106)
Creatinine, Ser: 0.77 mg/dL (ref 0.76–1.27)
Globulin, Total: 2.8 g/dL (ref 1.5–4.5)
Glucose: 116 mg/dL — ABNORMAL HIGH (ref 70–99)
Potassium: 3.7 mmol/L (ref 3.5–5.2)
Sodium: 140 mmol/L (ref 134–144)
Total Protein: 7.4 g/dL (ref 6.0–8.5)
eGFR: 110 mL/min/{1.73_m2} (ref >59)

## 2023-10-16 LAB — URIC ACID: Uric Acid: 10.8 mg/dL — ABNORMAL HIGH (ref 3.8–8.4)

## 2023-10-16 LAB — CBC
Hematocrit: 42.4 % (ref 37.5–51.0)
Hemoglobin: 12.7 g/dL — ABNORMAL LOW (ref 13.0–17.7)
MCH: 21.3 pg — ABNORMAL LOW (ref 26.6–33.0)
MCHC: 30 g/dL — ABNORMAL LOW (ref 31.5–35.7)
MCV: 71 fL — ABNORMAL LOW (ref 79–97)
Platelets: 202 10*3/uL (ref 150–450)
RBC: 5.96 x10E6/uL — ABNORMAL HIGH (ref 4.14–5.80)
RDW: 17.9 % — ABNORMAL HIGH (ref 11.6–15.4)
WBC: 10.6 10*3/uL (ref 3.4–10.8)

## 2023-10-16 LAB — LIPID PANEL
Chol/HDL Ratio: 5 ratio (ref 0.0–5.0)
Cholesterol, Total: 220 mg/dL — ABNORMAL HIGH (ref 100–199)
HDL: 44 mg/dL (ref >39)
LDL Chol Calc (NIH): 124 mg/dL — ABNORMAL HIGH (ref 0–99)
Triglycerides: 297 mg/dL — ABNORMAL HIGH (ref 0–149)
VLDL Cholesterol Cal: 52 mg/dL — ABNORMAL HIGH (ref 5–40)

## 2023-10-16 LAB — HEMOGLOBIN A1C
Est. average glucose Bld gHb Est-mCnc: 91 mg/dL
Hgb A1c MFr Bld: 4.8 % (ref 4.8–5.6)

## 2023-10-26 DIAGNOSIS — Z419 Encounter for procedure for purposes other than remedying health state, unspecified: Secondary | ICD-10-CM | POA: Diagnosis not present

## 2023-10-27 ENCOUNTER — Other Ambulatory Visit: Payer: Self-pay

## 2023-10-27 ENCOUNTER — Emergency Department: Payer: Medicaid Other

## 2023-10-27 ENCOUNTER — Emergency Department
Admission: EM | Admit: 2023-10-27 | Discharge: 2023-10-27 | Disposition: A | Discharge status: Home / Self Care | Payer: Medicaid Other | Attending: Emergency Medicine | Admitting: Emergency Medicine

## 2023-10-27 DIAGNOSIS — W540XXA Bitten by dog, initial encounter: Secondary | ICD-10-CM | POA: Insufficient documentation

## 2023-10-27 DIAGNOSIS — S51851A Open bite of right forearm, initial encounter: Secondary | ICD-10-CM | POA: Diagnosis not present

## 2023-10-27 DIAGNOSIS — S41151A Open bite of right upper arm, initial encounter: Secondary | ICD-10-CM | POA: Diagnosis not present

## 2023-10-27 MED ORDER — CYCLOBENZAPRINE HCL 5 MG PO TABS: 5.0000 mg | ORAL_TABLET | Freq: Three times a day (TID) | ORAL | 0 refills | Status: DC | PRN

## 2023-10-27 MED ORDER — KETOROLAC TROMETHAMINE 30 MG/ML IJ SOLN
30.0000 mg | Freq: Once | INTRAMUSCULAR | Status: AC
  Administered 2023-10-27: 30 mg via INTRAMUSCULAR
  Filled 2023-10-27: qty 1

## 2023-10-27 MED ORDER — TETANUS-DIPHTH-ACELL PERTUSSIS 5-2.5-18.5 LF-MCG/0.5 IM SUSY: 0.5000 mL | PREFILLED_SYRINGE | Freq: Once | INTRAMUSCULAR | Status: DC

## 2023-10-27 MED ORDER — AMOXICILLIN-POT CLAVULANATE 875-125 MG PO TABS
1.0000 | ORAL_TABLET | Freq: Once | ORAL | Status: AC
  Administered 2023-10-27: 1 via ORAL
  Filled 2023-10-27: qty 1

## 2023-10-27 MED ORDER — AMOXICILLIN-POT CLAVULANATE 875-125 MG PO TABS: 1.0000 | ORAL_TABLET | Freq: Two times a day (BID) | ORAL | 0 refills | Status: AC
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
## 2023-10-27 NOTE — ED Notes (Signed)
Wound sites cleaned with chlorhexidine soap and sterile water. Telfa and kerlex dressings applied to wound sites. Pt educated on wound care and given supplies for one dressing change. Pt verbalized understanding.
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
## 2023-10-27 NOTE — Discharge Instructions (Addendum)
Your exam and x-rays are normal and reassuring at this time.  No evidence of a bony injury, retained foreign body, or serious soft tissue injury.  You will experience muscle soreness and stiffness due to the injury.  Take the prescription muscle relaxant as needed along with over-the-counter ibuprofen or Aleve.  Apply ice to help reduce swelling and pain.  Take antibiotic as prescribed until all pills are gone.  Follow-up with your primary provider for ongoing concerns.  Return to the ED if needed.

## 2023-11-12 ENCOUNTER — Ambulatory Visit: Payer: Medicaid Other | Admitting: Family Medicine

## 2023-11-12 NOTE — Progress Notes (Deleted)
      Established patient visit   Patient: Shawn Wolfe   DOB: 07/31/1976   47 y.o. Male  MRN: [id number] Visit Date: 11/12/2023  Today's healthcare provider: [name], MD   No chief complaint on file.  Subjective       Discussed the use of AI scribe software for clinical note transcription with the patient, who gave verbal consent to proceed.  History of Present Illness             Past Medical History:  Diagnosis Date   Abnormal LFTs    Alcohol abuse    Allergy    Anxiety 09/15/2023   Arthritis    B12 deficiency    Blood transfusion without reported diagnosis    Chronic kidney disease    Depression 09/15/2023   Gout    Heart murmur    History of echocardiogram    a. 09/2021 Echo: EF 60-65%, no rwma, nl RV fxn, triv MR.   Hypertension    Iron deficiency anemia    Sleep apnea    Thalassemia minor     Medications: Outpatient Medications Prior to Visit  Medication Sig   allopurinol (ZYLOPRIM) 300 MG tablet Take 1 tablet (300 mg total) by mouth daily.   colchicine 0.6 MG tablet Take one tablet BID until gout is gone. Thereafter, can take as needed for gout flare: take 1.2 mg x1, followed by 0.6mg  x1. (Patient not taking: Reported on 10/15/2023)   cyclobenzaprine (FLEXERIL) 5 MG tablet Take 1 tablet (5 mg total) by mouth 3 (three) times daily as needed.   dapagliflozin propanediol (FARXIGA) 10 MG TABS tablet Take 1 tablet (10 mg total) by mouth daily. (Patient not taking: Reported on 06/11/2023)   folic acid (FOLVITE) 1 MG tablet Take 1 tablet (1 mg total) by mouth daily.   losartan (COZAAR) 25 MG tablet Take 1 tablet (25 mg total) by mouth daily.   Multiple Vitamin (MULTIVITAMIN WITH MINERALS) TABS tablet Take 1 tablet by mouth daily.   naltrexone (DEPADE) 50 MG tablet Take 1 tablet (50 mg total) by mouth daily.   ondansetron (ZOFRAN) 4 MG tablet Take 1 tablet (4 mg total) by mouth daily as needed for nausea or vomiting.   potassium chloride SA  (KLOR-CON M) 20 MEQ tablet Take 1 tablet (20 mEq total) by mouth 2 (two) times daily.   spironolactone (ALDACTONE) 25 MG tablet Take 1 tablet (25 mg total) by mouth daily.   No facility-administered medications prior to visit.    Review of Systems  {Insert previous labs (optional):23779} {See past labs  Heme  Chem  Endocrine  Serology  Results Review (optional):1}   Objective    There were no vitals taken for this visit. {Insert last BP/Wt (optional):23777}{See vitals history (optional):1}      Physical Exam  ***  No results found for any visits on 11/12/23.  Assessment & Plan     Problem List Items Addressed This Visit   None   Assessment and Plan              No follow-ups on file.         [name], MD  [facility] [phone] (phone) [phone] (fax)  [facility]

## 2023-11-25 DIAGNOSIS — Z419 Encounter for procedure for purposes other than remedying health state, unspecified: Secondary | ICD-10-CM | POA: Diagnosis not present

## 2023-12-16 ENCOUNTER — Emergency Department: Payer: Medicaid Other

## 2023-12-16 ENCOUNTER — Other Ambulatory Visit: Payer: Self-pay

## 2023-12-16 ENCOUNTER — Emergency Department
Admission: EM | Admit: 2023-12-16 | Discharge: 2023-12-16 | Disposition: A | Discharge status: Home / Self Care | Payer: Medicaid Other | Attending: Emergency Medicine | Admitting: Emergency Medicine

## 2023-12-16 ENCOUNTER — Encounter: Payer: Self-pay | Admitting: Emergency Medicine

## 2023-12-16 DIAGNOSIS — I129 Hypertensive chronic kidney disease with stage 1 through stage 4 chronic kidney disease, or unspecified chronic kidney disease: Secondary | ICD-10-CM | POA: Diagnosis not present

## 2023-12-16 DIAGNOSIS — R7401 Elevation of levels of liver transaminase levels: Secondary | ICD-10-CM | POA: Diagnosis not present

## 2023-12-16 DIAGNOSIS — R29818 Other symptoms and signs involving the nervous system: Secondary | ICD-10-CM | POA: Diagnosis not present

## 2023-12-16 DIAGNOSIS — R202 Paresthesia of skin: Secondary | ICD-10-CM | POA: Diagnosis not present

## 2023-12-16 DIAGNOSIS — N189 Chronic kidney disease, unspecified: Secondary | ICD-10-CM | POA: Insufficient documentation

## 2023-12-16 LAB — COMPREHENSIVE METABOLIC PANEL
ALT: 65 U/L — ABNORMAL HIGH (ref 0–44)
AST: 68 U/L — ABNORMAL HIGH (ref 15–41)
Albumin: 3.9 g/dL (ref 3.5–5.0)
Alkaline Phosphatase: 44 U/L (ref 38–126)
Anion gap: 10 (ref 5–15)
BUN: 7 mg/dL (ref 6–20)
CO2: 28 mmol/L (ref 22–32)
Calcium: 8.9 mg/dL (ref 8.9–10.3)
Chloride: 102 mmol/L (ref 98–111)
Creatinine, Ser: 0.63 mg/dL (ref 0.61–1.24)
GFR, Estimated: >60 mL/min (ref >60)
Glucose, Bld: 124 mg/dL — ABNORMAL HIGH (ref 70–99)
Potassium: 3.9 mmol/L (ref 3.5–5.1)
Sodium: 140 mmol/L (ref 135–145)
Total Bilirubin: 1 mg/dL (ref <1.2)
Total Protein: 7.5 g/dL (ref 6.5–8.1)

## 2023-12-16 LAB — CBC WITH DIFFERENTIAL/PLATELET
Abs Immature Granulocytes: 0.02 10*3/uL (ref 0.00–0.07)
Basophils Absolute: 0.1 10*3/uL (ref 0.0–0.1)
Basophils Relative: 1 %
Eosinophils Absolute: 0.1 10*3/uL (ref 0.0–0.5)
Eosinophils Relative: 2 %
HCT: 42.4 % (ref 39.0–52.0)
Hemoglobin: 13.1 g/dL (ref 13.0–17.0)
Immature Granulocytes: 0 %
Lymphocytes Relative: 39 %
Lymphs Abs: 2.3 10*3/uL (ref 0.7–4.0)
MCH: 20.5 pg — ABNORMAL LOW (ref 26.0–34.0)
MCHC: 30.9 g/dL (ref 30.0–36.0)
MCV: 66.3 fL — ABNORMAL LOW (ref 80.0–100.0)
Monocytes Absolute: 0.4 10*3/uL (ref 0.1–1.0)
Monocytes Relative: 7 %
Neutro Abs: 2.9 10*3/uL (ref 1.7–7.7)
Neutrophils Relative %: 51 %
Platelets: 222 10*3/uL (ref 150–400)
RBC: 6.4 MIL/uL — ABNORMAL HIGH (ref 4.22–5.81)
RDW: 18.6 % — ABNORMAL HIGH (ref 11.5–15.5)
WBC: 5.8 10*3/uL (ref 4.0–10.5)
nRBC: 0 % (ref 0.0–0.2)

## 2023-12-16 MED ORDER — GABAPENTIN 100 MG PO CAPS
100.0000 mg | ORAL_CAPSULE | Freq: Once | ORAL | Status: AC
  Administered 2023-12-16: 100 mg via ORAL
  Filled 2023-12-16: qty 1

## 2023-12-16 MED ORDER — GABAPENTIN 100 MG PO CAPS: 100.0000 mg | ORAL_CAPSULE | Freq: Two times a day (BID) | ORAL | 2 refills | Status: DC

## 2023-12-16 NOTE — ED Notes (Addendum)
Blue top and dark green top sent down for hold.

## 2023-12-16 NOTE — ED Triage Notes (Signed)
Patient endorses numbness in toes, fingers and lower lip x 1.5 weeks. Patient denies history of same.
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
## 2023-12-26 DIAGNOSIS — Z419 Encounter for procedure for purposes other than remedying health state, unspecified: Secondary | ICD-10-CM | POA: Diagnosis not present

## 2024-01-04 ENCOUNTER — Ambulatory Visit: Payer: Medicaid Other | Admitting: Family Medicine

## 2024-01-04 VITALS — BP 135/86 | HR 120 | Ht 66.0 in | Wt 186.8 lb

## 2024-01-04 DIAGNOSIS — G629 Polyneuropathy, unspecified: Secondary | ICD-10-CM | POA: Insufficient documentation

## 2024-01-04 DIAGNOSIS — I10 Essential (primary) hypertension: Secondary | ICD-10-CM

## 2024-01-04 MED ORDER — GABAPENTIN 100 MG PO CAPS: 200.0000 mg | ORAL_CAPSULE | Freq: Three times a day (TID) | ORAL | 8 refills | Status: DC

## 2024-01-04 NOTE — Assessment & Plan Note (Signed)
 Numbness and tingling in fingers and toes, with shooting pain in toes. Symptoms partially responsive to Gabapentin  100mg  twice daily. No known history of diabetes, but history of chronic alcohol use and low Vitamin B12 levels in the past. Peripheral neuropathy vs Arterial disease/Raynaud's vs vitamin B12 deficiency - Good cap refill and pulses during physical - Pt very sensitive to touch on distal extremities -Increase Gabapentin  to 200mg  three times daily. -Check Vitamin B12 levels and CMP. -Refer to vascular for workup of potential vascular issues. - Continue ETOH usage decrease

## 2024-01-04 NOTE — Progress Notes (Signed)
 Acute Office Visit  Introduced to nurse practitioner role and practice setting.  All questions answered.  Discussed provider/patient relationship and expectations.   Subjective:     Patient ID: Shawn Wolfe, male    DOB: [birth year], 48 y.o.   MRN: [id number]  Chief Complaint  Patient presents with   Numbness    Numbness in some fingers in toes. started in toes 5 wks ago and then moved into the fingers. Cold weather isn't helping much. When going to lay down patient has a shooting pain from the calves to the toes. Toes are sensitive to touch. Gabapentin  has helped a little bit.    Pt presents with concerns for numbness and tingling in the fingers and toes. The symptoms have been partially managed with gabapentin  100mg  twice daily prescribed by ED on 12/16/23, which the patient self-adjusted to 200mg  at night to help with sleep.  Pain - shooting, particularly noticeable after work and during relaxation periods, more severe at night.  Alcohol use-  reduced but not eliminated. The patient reports drinking approximately six to eight beers when he does drink. History of low vitamin B12 levels and is currently taking a supplement of 1000mcg and taking potassium supplements twice daily per PCP.  Numbness/Shooting pain - more in mid-calf to toes and in fingers knuckles down. The patient reports that this numbness can interfere with his ability to walk, particularly as he is on his feet frequently for work. Hands can become very cold, particularly when exposed to cold environments such as a freezer. The patient also reports sensitivity to cold in his feet, to the point of needing to wear socks to bed to avoid contact with the blanket.  The patient has been using lidocaine  patches on the knuckles of his toes to help manage the pain and allow for better sleep. The patient also reports using [facility] on his ankles occasionally. The patient has a history of gout, but does not believe the current  symptoms are related to this condition. The patient reports no recent infections, trauma, or other significant health events.    Review of Systems  All other systems reviewed and are negative.  Flowsheet Row Office Visit from 01/04/2024 in [facility]  AUDIT-C Score 11        Flowsheet Row Office Visit from 01/04/2024 in [facility]  PHQ-9 Total Score 8         01/04/2024   11:08 AM 10/15/2023    9:46 AM  GAD 7 : Generalized Anxiety Score  Nervous, Anxious, on Edge 0 1  Control/stop worrying 1 2  Worry too much - different things 1 0  Trouble relaxing 1 2  Restless 0 0  Easily annoyed or irritable 0 0  Afraid - awful might happen 0 0  Total GAD 7 Score 3 5  Anxiety Difficulty Not difficult at all Somewhat difficult        Objective:    BP 135/86 (BP Location: Left Arm, Patient Position: Sitting, Cuff Size: Large)   Pulse (!) 120   Ht 5' 6 (1.676 m)   Wt 186 lb 12.8 oz (84.7 kg)   SpO2 98%   BMI 30.15 kg/m    Physical Exam Constitutional:      General: He is not in acute distress.    Appearance: Normal appearance. He is not ill-appearing, toxic-appearing or diaphoretic.  Eyes:     Extraocular Movements: Extraocular movements intact.     Pupils: Pupils  are equal, round, and reactive to light.  Cardiovascular:     Rate and Rhythm: Regular rhythm. Tachycardia present.     Pulses:          Radial pulses are 2+ on the right side and 2+ on the left side.       Dorsalis pedis pulses are 1+ on the right side and 1+ on the left side.       Posterior tibial pulses are 2+ on the right side and 2+ on the left side.     Heart sounds: Normal heart sounds. No murmur heard.    No friction rub. No gallop.  Pulmonary:     Effort: Pulmonary effort is normal. No respiratory distress.     Breath sounds: Normal breath sounds. No stridor. No wheezing, rhonchi or rales.  Chest:     Chest wall: No tenderness.  Musculoskeletal:      Right lower leg: No edema.     Left lower leg: No edema.  Skin:    General: Skin is warm and dry.  Neurological:     Mental Status: He is alert.     Sensory: Sensory deficit present.     Comments: Numbness and tingling present in all fingers from knuckle down, sensitive to touch. Symptoms also present in midcalf to toes  Psychiatric:        Attention and Perception: Attention and perception normal.        Mood and Affect: Affect normal. Mood is anxious.        Speech: Speech normal.        Behavior: Behavior normal. Behavior is cooperative.        Thought Content: Thought content normal.        Cognition and Memory: Cognition and memory normal.        Judgment: Judgment normal.    No results found for any visits on 01/04/24.     Assessment & Plan:   Problem List Items Addressed This Visit       Cardiovascular and Mediastinum   Benign essential HTN   Chronic  Elevated BP in office (136/86), improved from previous visit Continue Losartan  25mg  and continue spironolactone  25mg  daily  Monitor at home with upper arm cuff Reduce ETOH, sodium intake, processed food Increase water intake and daily exercise          Nervous and Auditory   Peripheral polyneuropathy - Primary   Numbness and tingling in fingers and toes, with shooting pain in toes. Symptoms partially responsive to Gabapentin  100mg  twice daily. No known history of diabetes, but history of chronic alcohol use and low Vitamin B12 levels in the past. Peripheral neuropathy vs Arterial disease/Raynaud's vs vitamin B12 deficiency - Good cap refill and pulses during physical - Pt very sensitive to touch on distal extremities -Increase Gabapentin  to 200mg  three times daily. -Check Vitamin B12 levels and CMP. -Refer to vascular for workup of potential vascular issues. - Continue ETOH usage decrease      Relevant Medications   gabapentin  (NEURONTIN ) 100 MG capsule   Other Relevant Orders   Vitamin B12    Comprehensive metabolic panel   Ambulatory referral to Vascular Surgery      Meds ordered this encounter  Medications   gabapentin  (NEURONTIN ) 100 MG capsule    Sig: Take 2 capsules (200 mg total) by mouth 3 (three) times daily.    Dispense:  60 capsule    Refill:  8    Return if symptoms worsen or  fail to improve. Please schedule appt with Dr. Lang for chronic disease mgmt.   I, Curtis DELENA Boom, FNP, have reviewed all documentation for this visit. The documentation on 01/04/24 for the exam, diagnosis, procedures, and orders are all accurate and complete.   Curtis DELENA Boom, FNP

## 2024-01-04 NOTE — Assessment & Plan Note (Signed)
 Chronic  Elevated BP in office (136/86), improved from previous visit Continue Losartan  25mg  and continue spironolactone  25mg  daily  Monitor at home with upper arm cuff Reduce ETOH, sodium intake, processed food Increase water intake and daily exercise

## 2024-01-22 ENCOUNTER — Ambulatory Visit: Payer: Medicaid Other | Admitting: Family Medicine

## 2024-01-22 NOTE — Progress Notes (Deleted)
Established patient visit   Patient: Shawn Wolfe   DOB: Feb 01, 1976   48 y.o. Male  MRN: [id number] Visit Date: 01/22/2024  Today's healthcare provider: [name], MD   No chief complaint on file.  Subjective       Discussed the use of AI scribe software for clinical note transcription with the patient, who gave verbal consent to proceed.  History of Present Illness             Past Medical History:  Diagnosis Date   Abnormal LFTs    Alcohol abuse    Allergy    Anxiety 09/15/2023   Arthritis    B12 deficiency    Blood transfusion without reported diagnosis    Chronic kidney disease    Depression 09/15/2023   Gout    Heart murmur    History of echocardiogram    a. 09/2021 Echo: EF 60-65%, no rwma, nl RV fxn, triv MR.   Hypertension    Iron deficiency anemia    Sleep apnea    Thalassemia minor     Medications: Outpatient Medications Prior to Visit  Medication Sig   allopurinol (ZYLOPRIM) 300 MG tablet Take 1 tablet (300 mg total) by mouth daily.   folic acid (FOLVITE) 1 MG tablet Take 1 tablet (1 mg total) by mouth daily.   gabapentin (NEURONTIN) 100 MG capsule Take 2 capsules (200 mg total) by mouth 3 (three) times daily.   losartan (COZAAR) 25 MG tablet Take 1 tablet (25 mg total) by mouth daily.   Multiple Vitamin (MULTIVITAMIN WITH MINERALS) TABS tablet Take 1 tablet by mouth daily.   naltrexone (DEPADE) 50 MG tablet Take 1 tablet (50 mg total) by mouth daily.   ondansetron (ZOFRAN) 4 MG tablet Take 1 tablet (4 mg total) by mouth daily as needed for nausea or vomiting.   potassium chloride SA (KLOR-CON M) 20 MEQ tablet Take 1 tablet (20 mEq total) by mouth 2 (two) times daily.   spironolactone (ALDACTONE) 25 MG tablet Take 1 tablet (25 mg total) by mouth daily.   No facility-administered medications prior to visit.    Review of Systems  Last CBC Lab Results  Component Value Date   WBC 5.8 12/16/2023   HGB 13.1 12/16/2023    HCT 42.4 12/16/2023   MCV 66.3 (L) 12/16/2023   MCH 20.5 (L) 12/16/2023   RDW 18.6 (H) 12/16/2023   PLT 222 12/16/2023   Last metabolic panel Lab Results  Component Value Date   GLUCOSE 124 (H) 12/16/2023   NA 140 12/16/2023   K 3.9 12/16/2023   CL 102 12/16/2023   CO2 28 12/16/2023   BUN 7 12/16/2023   CREATININE 0.63 12/16/2023   GFRNONAA >60 12/16/2023   CALCIUM 8.9 12/16/2023   PHOS 3.6 04/09/2023   PROT 7.5 12/16/2023   ALBUMIN 3.9 12/16/2023   LABGLOB 2.8 10/15/2023   BILITOT 1.0 12/16/2023   ALKPHOS 44 12/16/2023   AST 68 (H) 12/16/2023   ALT 65 (H) 12/16/2023   ANIONGAP 10 12/16/2023   Last hemoglobin A1c Lab Results  Component Value Date   HGBA1C 4.8 10/15/2023   Last thyroid functions Lab Results  Component Value Date   TSH 2.938 04/06/2023   Last vitamin B12 and Folate Lab Results  Component Value Date   VITAMINB12 1,152 (H) 04/06/2023   FOLATE 3.5 (L) 04/06/2023     {See past labs  Heme  Chem  Endocrine  Serology  Results Review (optional):1}  Objective    There were no vitals taken for this visit. BP Readings from Last 3 Encounters:  01/04/24 135/86  12/16/23 136/88  10/27/23 (!) 139/91   Wt Readings from Last 3 Encounters:  01/04/24 186 lb 12.8 oz (84.7 kg)  12/16/23 185 lb (83.9 kg)  10/27/23 190 lb (86.2 kg)    {See vitals history (optional):1}    Physical Exam  ***  No results found for any visits on 01/22/24.  Assessment & Plan     Problem List Items Addressed This Visit   None   Assessment and Plan              No follow-ups on file.         [name], MD  [facility] [phone] (phone) [phone] (fax)  [facility]

## 2024-01-26 DIAGNOSIS — Z419 Encounter for procedure for purposes other than remedying health state, unspecified: Secondary | ICD-10-CM | POA: Diagnosis not present

## 2024-01-29 ENCOUNTER — Encounter (INDEPENDENT_AMBULATORY_CARE_PROVIDER_SITE_OTHER): Payer: Self-pay

## 2024-02-06 ENCOUNTER — Encounter (INDEPENDENT_AMBULATORY_CARE_PROVIDER_SITE_OTHER): Payer: Self-pay | Admitting: Nurse Practitioner

## 2024-02-23 DIAGNOSIS — Z419 Encounter for procedure for purposes other than remedying health state, unspecified: Secondary | ICD-10-CM | POA: Diagnosis not present

## 2024-04-05 DIAGNOSIS — Z419 Encounter for procedure for purposes other than remedying health state, unspecified: Secondary | ICD-10-CM | POA: Diagnosis not present

## 2024-04-13 ENCOUNTER — Other Ambulatory Visit: Payer: Self-pay | Admitting: Family Medicine

## 2024-04-13 DIAGNOSIS — G629 Polyneuropathy, unspecified: Secondary | ICD-10-CM

## 2024-04-22 ENCOUNTER — Other Ambulatory Visit: Payer: Self-pay | Admitting: Family Medicine

## 2024-04-22 DIAGNOSIS — K709 Alcoholic liver disease, unspecified: Secondary | ICD-10-CM

## 2024-04-22 DIAGNOSIS — F1020 Alcohol dependence, uncomplicated: Secondary | ICD-10-CM

## 2024-04-24 NOTE — Telephone Encounter (Signed)
 Too soon for refill, last refill 10/03/23 for 90 and 3 RF.  Requested Prescriptions  Pending Prescriptions Disp Refills   folic acid  (FOLVITE ) 1 MG tablet [Pharmacy Med Name: FOLIC ACID  1 MG TABLET] 90 tablet 3    Sig: TAKE 1 TABLET BY MOUTH EVERY DAY     Endocrinology:  Vitamins Failed - 04/24/2024 11:50 AM      Failed - Valid encounter within last 12 months    Recent Outpatient Visits   None

## 2024-04-26 ENCOUNTER — Emergency Department
Admission: EM | Admit: 2024-04-26 | Discharge: 2024-04-26 | Disposition: A | Discharge status: Home / Self Care | Attending: Emergency Medicine | Admitting: Emergency Medicine

## 2024-04-26 ENCOUNTER — Other Ambulatory Visit: Payer: Self-pay

## 2024-04-26 DIAGNOSIS — R55 Syncope and collapse: Secondary | ICD-10-CM | POA: Insufficient documentation

## 2024-04-26 LAB — BASIC METABOLIC PANEL WITH GFR
Anion gap: 10 (ref 5–15)
BUN: 12 mg/dL (ref 6–20)
CO2: 26 mmol/L (ref 22–32)
Calcium: 8.4 mg/dL — ABNORMAL LOW (ref 8.9–10.3)
Chloride: 105 mmol/L (ref 98–111)
Creatinine, Ser: 0.73 mg/dL (ref 0.61–1.24)
GFR, Estimated: >60 mL/min (ref >60)
Glucose, Bld: 117 mg/dL — ABNORMAL HIGH (ref 70–99)
Potassium: 3.2 mmol/L — ABNORMAL LOW (ref 3.5–5.1)
Sodium: 141 mmol/L (ref 135–145)

## 2024-04-26 LAB — CBC WITH DIFFERENTIAL/PLATELET
Abs Immature Granulocytes: 0.02 10*3/uL (ref 0.00–0.07)
Basophils Absolute: 0 10*3/uL (ref 0.0–0.1)
Basophils Relative: 1 %
Eosinophils Absolute: 0.1 10*3/uL (ref 0.0–0.5)
Eosinophils Relative: 1 %
HCT: 37.1 % — ABNORMAL LOW (ref 39.0–52.0)
Hemoglobin: 11.5 g/dL — ABNORMAL LOW (ref 13.0–17.0)
Immature Granulocytes: 0 %
Lymphocytes Relative: 32 %
Lymphs Abs: 1.9 10*3/uL (ref 0.7–4.0)
MCH: 21.3 pg — ABNORMAL LOW (ref 26.0–34.0)
MCHC: 31 g/dL (ref 30.0–36.0)
MCV: 68.6 fL — ABNORMAL LOW (ref 80.0–100.0)
Monocytes Absolute: 0.3 10*3/uL (ref 0.1–1.0)
Monocytes Relative: 5 %
Neutro Abs: 3.7 10*3/uL (ref 1.7–7.7)
Neutrophils Relative %: 61 %
Platelets: 184 10*3/uL (ref 150–400)
RBC: 5.41 MIL/uL (ref 4.22–5.81)
RDW: 17.1 % — ABNORMAL HIGH (ref 11.5–15.5)
WBC: 6 10*3/uL (ref 4.0–10.5)
nRBC: 0 % (ref 0.0–0.2)

## 2024-04-26 LAB — TROPONIN I (HIGH SENSITIVITY): Troponin I (High Sensitivity): <2 ng/L (ref <18)

## 2024-04-26 LAB — CBG MONITORING, ED: Glucose-Capillary: 111 mg/dL — ABNORMAL HIGH (ref 70–99)

## 2024-04-26 MED ORDER — POTASSIUM CHLORIDE CRYS ER 20 MEQ PO TBCR: 40.0000 meq | EXTENDED_RELEASE_TABLET | Freq: Once | ORAL | Status: DC

## 2024-04-26 NOTE — ED Provider Notes (Signed)
 [facility] Provider Note    Event Date/Time   First MD Initiated Contact with Patient 04/26/24 1941     (approximate)   History   Syncope   HPI  Kaulana Bunker is a 48 y.o. male   who presents to the emergency department today via EMS after an apparent syncope episode.  The patient states he was at work.  He then remembers waking up on the floor in his office.  Other than being slightly tired today he denies feeling bad prior to this episode.  He denies any chest pain or palpitations.  Has passed out a couple times in the past which she thinks was related to stress. Denies any new medications.       Physical Exam   Triage Vital Signs: ED Triage Vitals  Encounter Vitals Group     BP 04/26/24 1937 (!) 166/101     Systolic BP Percentile --      Diastolic BP Percentile --      Pulse Rate 04/26/24 1937 95     Resp 04/26/24 1937 18     Temp 04/26/24 1938 98.3 F (36.8 C)     Temp Source 04/26/24 1938 Oral     SpO2 04/26/24 1937 99 %     Weight 04/26/24 1938 180 lb (81.6 kg)     Height 04/26/24 1938 5\' 6"  (1.676 m)     Head Circumference --      Peak Flow --      Pain Score 04/26/24 1938 0     Pain Loc --      Pain Education --      Exclude from Growth Chart --     Most recent vital signs: Vitals:   04/26/24 1937 04/26/24 1938  BP: (!) 166/101   Pulse: 95   Resp: 18   Temp:  98.3 F (36.8 C)  SpO2: 99%    General: Awake, alert, oriented. CV:  Good peripheral perfusion. Regular rate and rhythm. Resp:  Normal effort. Lungs clear. Abd:  No distention.    ED Results / Procedures / Treatments   Labs (all labs ordered are listed, but only abnormal results are displayed) Labs Reviewed  BASIC METABOLIC PANEL WITH GFR - Abnormal; Notable for the following components:      Result Value   Potassium 3.2 (*)    Glucose, Bld 117 (*)    Calcium  8.4 (*)    All other components within normal limits  CBC WITH DIFFERENTIAL/PLATELET -  Abnormal; Notable for the following components:   Hemoglobin 11.5 (*)    HCT 37.1 (*)    MCV 68.6 (*)    MCH 21.3 (*)    RDW 17.1 (*)    All other components within normal limits  CBG MONITORING, ED - Abnormal; Notable for the following components:   Glucose-Capillary 111 (*)    All other components within normal limits  TROPONIN I (HIGH SENSITIVITY)     EKG  I, Marylynn Soho, attending physician, personally viewed and interpreted this EKG  EKG Time: 2000 Rate: 83 Rhythm: normal sinus rhythm Axis: normal Intervals: qtc 465 QRS: narrow ST changes: no st elevation Impression: normal ekg   RADIOLOGY None   PROCEDURES:  Critical Care performed: No    MEDICATIONS ORDERED IN ED: Medications - No data to display   IMPRESSION / MDM / ASSESSMENT AND PLAN / ED COURSE  I reviewed the triage vital signs and the nursing notes.  Differential diagnosis includes, but is not limited to, anemia, arrhythmia, electrolyte abnormality, vaso vagal  Patient's presentation is most consistent with acute presentation with potential threat to life or bodily function.   The patient is on the cardiac monitor to evaluate for evidence of arrhythmia and/or significant heart rate changes.  Patient presented to the emergency department today after a syncopal episode that occurred at work.  On my exam patient states that other than being a little tired he feels okay.  Denies any chest pain or palpitations.  EKG shows normal sinus rhythm without any ST elevation.  Blood work shows slight hypokalemia but otherwise no concerning abnormalities.  I did discuss the hypokalemia with the patient and offered to give patient potassium here however he states he has pills at home and can take them at home.  At this time I do think is reasonable for patient to be discharged.  I discussed and encouraged follow-up with primary care.      FINAL CLINICAL IMPRESSION(S) / ED  DIAGNOSES   Final diagnoses:  Syncope, unspecified syncope type      Note:  This document was prepared using Dragon voice recognition software and may include unintentional dictation errors.    Marylynn Soho, MD 04/26/24 2203

## 2024-04-26 NOTE — ED Triage Notes (Signed)
 BIB ems from work, found at his desk unresponsive  Unknown LOC, fell out of chair  Pt was a&o upon ems arrival complaining of dizziness  Pt a&ox4 on arrival no complaints during triage

## 2024-05-05 DIAGNOSIS — Z419 Encounter for procedure for purposes other than remedying health state, unspecified: Secondary | ICD-10-CM | POA: Diagnosis not present

## 2024-05-13 ENCOUNTER — Encounter (INDEPENDENT_AMBULATORY_CARE_PROVIDER_SITE_OTHER): Payer: Self-pay

## 2024-05-29 ENCOUNTER — Other Ambulatory Visit: Payer: Self-pay | Admitting: Family Medicine

## 2024-06-05 DIAGNOSIS — Z419 Encounter for procedure for purposes other than remedying health state, unspecified: Secondary | ICD-10-CM | POA: Diagnosis not present

## 2024-07-05 DIAGNOSIS — Z419 Encounter for procedure for purposes other than remedying health state, unspecified: Secondary | ICD-10-CM | POA: Diagnosis not present

## 2024-07-08 ENCOUNTER — Other Ambulatory Visit: Payer: Self-pay | Admitting: Family Medicine

## 2024-07-08 DIAGNOSIS — G629 Polyneuropathy, unspecified: Secondary | ICD-10-CM

## 2024-08-05 DIAGNOSIS — Z419 Encounter for procedure for purposes other than remedying health state, unspecified: Secondary | ICD-10-CM | POA: Diagnosis not present

## 2024-08-14 ENCOUNTER — Other Ambulatory Visit: Payer: Self-pay | Admitting: Family Medicine

## 2024-08-14 DIAGNOSIS — G629 Polyneuropathy, unspecified: Secondary | ICD-10-CM

## 2024-09-05 DIAGNOSIS — Z419 Encounter for procedure for purposes other than remedying health state, unspecified: Secondary | ICD-10-CM | POA: Diagnosis not present

## 2024-09-14 ENCOUNTER — Other Ambulatory Visit: Payer: Self-pay | Admitting: Family Medicine

## 2024-09-14 DIAGNOSIS — G629 Polyneuropathy, unspecified: Secondary | ICD-10-CM

## 2024-09-15 NOTE — Telephone Encounter (Signed)
 Needs CPE appt/last CPE 10/15/23.

## 2024-09-15 NOTE — Telephone Encounter (Signed)
 Requested medications are due for refill today.  unsure  Requested medications are on the active medications list.  yes  Last refill. 08/14/2024 #60 1 rf - a 20 day supply  Future visit scheduled.   no  Notes to clinic.  Please review for refill. Pt has been given very small supplies (20 days).    Requested Prescriptions  Pending Prescriptions Disp Refills   gabapentin  (NEURONTIN ) 100 MG capsule [Pharmacy Med Name: GABAPENTIN  100 MG CAPSULE] 60 capsule 1    Sig: TAKE 2 CAPSULES BY MOUTH 3 TIMES A DAY     Neurology: Anticonvulsants - gabapentin  Failed - 09/15/2024  3:43 PM      Failed - Valid encounter within last 12 months    Recent Outpatient Visits   None            Passed - Cr in normal range and within 360 days    Creatinine, Ser  Date Value Ref Range Status  04/26/2024 0.73 0.61 - 1.24 mg/dL Final         Passed - Completed PHQ-2 or PHQ-9 in the last 360 days

## 2024-11-01 ENCOUNTER — Other Ambulatory Visit: Payer: Self-pay | Admitting: Family Medicine

## 2024-11-01 DIAGNOSIS — M1A49X0 Other secondary chronic gout, multiple sites, without tophus (tophi): Secondary | ICD-10-CM

## 2024-11-03 NOTE — Telephone Encounter (Signed)
 OV 01/04/24- needs appointment Requested Prescriptions  Pending Prescriptions Disp Refills   allopurinol  (ZYLOPRIM ) 300 MG tablet [Pharmacy Med Name: ALLOPURINOL  300 MG TABLET] 90 tablet 3    Sig: TAKE 1 TABLET BY MOUTH EVERY DAY     Endocrinology:  Gout Agents - allopurinol  Failed - 11/03/2024  1:50 PM      Failed - Uric Acid in normal range and within 360 days    Uric Acid  Date Value Ref Range Status  10/15/2023 10.8 (H) 3.8 - 8.4 mg/dL Final    Comment:               Therapeutic target for gout patients: <6.0         Failed - Valid encounter within last 12 months    Recent Outpatient Visits   None            Passed - Cr in normal range and within 360 days    Creatinine, Ser  Date Value Ref Range Status  04/26/2024 0.73 0.61 - 1.24 mg/dL Final         Passed - CBC within normal limits and completed in the last 12 months    WBC  Date Value Ref Range Status  04/26/2024 6.0 4.0 - 10.5 K/uL Final   RBC  Date Value Ref Range Status  04/26/2024 5.41 4.22 - 5.81 MIL/uL Final   Hemoglobin  Date Value Ref Range Status  04/26/2024 11.5 (L) 13.0 - 17.0 g/dL Final  89/78/7975 87.2 (L) 13.0 - 17.7 g/dL Final   HCT  Date Value Ref Range Status  04/26/2024 37.1 (L) 39.0 - 52.0 % Final   Hematocrit  Date Value Ref Range Status  10/15/2023 42.4 37.5 - 51.0 % Final   MCHC  Date Value Ref Range Status  04/26/2024 31.0 30.0 - 36.0 g/dL Final   [facility]  Date Value Ref Range Status  04/26/2024 21.3 (L) 26.0 - 34.0 pg Final   MCV  Date Value Ref Range Status  04/26/2024 68.6 (L) 80.0 - 100.0 fL Final  10/15/2023 71 (L) 79 - 97 fL Final   No results found for: PLTCOUNTKUC, LABPLAT, POCPLA RDW  Date Value Ref Range Status  04/26/2024 17.1 (H) 11.5 - 15.5 % Final  10/15/2023 17.9 (H) 11.6 - 15.4 % Final

## 2024-11-05 ENCOUNTER — Other Ambulatory Visit: Payer: Self-pay | Admitting: Family Medicine

## 2024-11-05 DIAGNOSIS — G629 Polyneuropathy, unspecified: Secondary | ICD-10-CM

## 2024-11-05 DIAGNOSIS — Z419 Encounter for procedure for purposes other than remedying health state, unspecified: Secondary | ICD-10-CM | POA: Diagnosis not present
